# Patient Record
Sex: Male | Born: 1995
Health system: Southern US, Community
[De-identification: ages and names within clinical notes are randomized; demographics above are authoritative.]

## PROBLEM LIST (undated history)

## (undated) ENCOUNTER — Ambulatory Visit
Admission: EM | Disposition: A | Discharge status: Home / Self Care | Payer: No Typology Code available for payment source

## (undated) DIAGNOSIS — S069XAA Unspecified intracranial injury with loss of consciousness status unknown, initial encounter: Secondary | ICD-10-CM

## (undated) DIAGNOSIS — S069X9A Unspecified intracranial injury with loss of consciousness of unspecified duration, initial encounter: Secondary | ICD-10-CM

## (undated) DIAGNOSIS — S42109A Fracture of unspecified part of scapula, unspecified shoulder, initial encounter for closed fracture: Secondary | ICD-10-CM

## (undated) HISTORY — DX: Unspecified intracranial injury with loss of consciousness status unknown, initial encounter: S06.9XAA

## (undated) HISTORY — DX: Unspecified intracranial injury with loss of consciousness of unspecified duration, initial encounter: S06.9X9A

## (undated) HISTORY — DX: Fracture of unspecified part of scapula, unspecified shoulder, initial encounter for closed fracture: S42.109A

---
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
## 2016-08-20 DIAGNOSIS — L237 Allergic contact dermatitis due to plants, except food: Secondary | ICD-10-CM | POA: Diagnosis not present

## 2016-10-14 DIAGNOSIS — S9031XA Contusion of right foot, initial encounter: Secondary | ICD-10-CM | POA: Diagnosis not present

## 2016-12-02 DIAGNOSIS — Z0100 Encounter for examination of eyes and vision without abnormal findings: Secondary | ICD-10-CM | POA: Diagnosis not present

## 2018-03-14 ENCOUNTER — Ambulatory Visit (INDEPENDENT_AMBULATORY_CARE_PROVIDER_SITE_OTHER): Payer: 59 | Admitting: Family Medicine

## 2018-03-14 ENCOUNTER — Encounter: Payer: Self-pay | Admitting: Family Medicine

## 2018-03-14 VITALS — BP 132/74 | HR 64 | Temp 98.2°F | Resp 12 | Ht 70.0 in | Wt 172.0 lb

## 2018-03-14 DIAGNOSIS — Z7689 Persons encountering health services in other specified circumstances: Secondary | ICD-10-CM

## 2018-03-14 DIAGNOSIS — Z0000 Encounter for general adult medical examination without abnormal findings: Secondary | ICD-10-CM | POA: Diagnosis not present

## 2018-03-14 NOTE — Progress Notes (Signed)
Subjective:    Patient ID: Matthew Young, male    DOB: [birth year], 22 y.o.   MRN: [id number]  HPI  Patient is a 22 year old white male here today to establish care.  He has no specific concerns.  He is due for a tetanus shot which he politely declines.  He is also due for a flu shot which he declines.  Regarding his quality metrics, he is due for an HIV screen however the patient refuses that today.  He denies any medical concerns. No past medical history on file. Patient reports no specific past medical history.  He has never been hospitalized.  He denies any surgeries.  He takes no regular medication. No current outpatient medications on file prior to visit.   No current facility-administered medications on file prior to visit.    Allergies  Allergen Reactions  . Amoxicillin Other (See Comments)    childhood  . Penicillin G Other (See Comments)    Childhood   Social History   Socioeconomic History  . Marital status: Single    Spouse name: Not on file  . Number of children: Not on file  . Years of education: Not on file  . Highest education level: Not on file  Occupational History  . Not on file  Social Needs  . Financial resource strain: Not on file  . Food insecurity:    Worry: Not on file    Inability: Not on file  . Transportation needs:    Medical: Not on file    Non-medical: Not on file  Tobacco Use  . Smoking status: Never Smoker  . Smokeless tobacco: Never Used  Substance and Sexual Activity  . Alcohol use: Not on file    Comment: Occasional  . Drug use: Never  . Sexual activity: Not on file  Lifestyle  . Physical activity:    Days per week: Not on file    Minutes per session: Not on file  . Stress: Not on file  Relationships  . Social connections:    Talks on phone: Not on file    Gets together: Not on file    Attends religious service: Not on file    Active member of club or organization: Not on file    Attends meetings of clubs or  organizations: Not on file    Relationship status: Not on file  . Intimate partner violence:    Fear of current or ex partner: Not on file    Emotionally abused: Not on file    Physically abused: Not on file    Forced sexual activity: Not on file  Other Topics Concern  . Not on file  Social History Narrative  . Not on file   He denies any smoking or tobacco use.  He reports occasional alcohol use.  He denies any recreational drug use.  He is married with no children.  He works in [employer]eron[employer]. Family History  Problem Relation Age of Onset  . Cancer Maternal Aunt        prostate  . Hypertension Paternal Uncle   . Hyperlipidemia Paternal Uncle   . Heart disease Paternal Uncle   . Hyperlipidemia Paternal Grandmother   . Hypertension Paternal Grandmother   . Hyperlipidemia Paternal Grandfather   . Hypertension Paternal Grandfather    Family history is significant for a paternal uncle who died suddenly in his 5440s from a presumed heart attack.  Review of Systems  All other systems reviewed and are negative.  Objective:   Physical Exam  Constitutional: He is oriented to person, place, and time. He appears well-developed and well-nourished. No distress.  HENT:  Head: Normocephalic and atraumatic.  Right Ear: External ear normal.  Left Ear: External ear normal.  Nose: Nose normal.  Mouth/Throat: Oropharynx is clear and moist. No oropharyngeal exudate.  Eyes: Pupils are equal, round, and reactive to light. Conjunctivae and EOM are normal. Right eye exhibits no discharge. Left eye exhibits no discharge. No scleral icterus.  Neck: Normal range of motion. Neck supple. No JVD present. No tracheal deviation present. No thyromegaly present.  Cardiovascular: Normal rate, regular rhythm, normal heart sounds and intact distal pulses. Exam reveals no gallop and no friction rub.  No murmur heard. Pulmonary/Chest: Effort normal and breath sounds normal. No stridor. No respiratory distress.  He has no wheezes. He has no rales. He exhibits no tenderness.  Abdominal: Soft. Bowel sounds are normal. He exhibits no distension and no mass. There is no tenderness. There is no rebound and no guarding.  Musculoskeletal: Normal range of motion. He exhibits no edema, tenderness or deformity.  Lymphadenopathy:    He has no cervical adenopathy.  Neurological: He is alert and oriented to person, place, and time. He has normal reflexes. He displays normal reflexes. No cranial nerve deficit. He exhibits normal muscle tone. Coordination normal.  Skin: Skin is warm. No rash noted. He is not diaphoretic. No erythema. No pallor.  Psychiatric: He has a normal mood and affect. His behavior is normal. Judgment and thought content normal.  Vitals reviewed.         Assessment & Plan:  General medical exam - Plan: CBC with Differential/Platelet, COMPLETE METABOLIC PANEL WITH GFR, Lipid panel  Establishing care with new doctor, encounter for   Physical exam today is completely normal.  Blood pressure is excellent.  BMI is normal.  Recommended a tetanus vaccine but the patient deferred.  Offer the patient HIV screening but he politely declined.  I will check a CBC, CMP, fasting lipid panel.  Otherwise there are no concerns that I have based on his physical exam.  Follow-up annually or as needed

## 2018-03-15 ENCOUNTER — Encounter: Payer: Self-pay | Admitting: Family Medicine

## 2018-03-15 LAB — CBC WITH DIFFERENTIAL/PLATELET
Basophils Absolute: 49 cells/uL (ref 0–200)
Basophils Relative: 1 %
Eosinophils Absolute: 172 cells/uL (ref 15–500)
Eosinophils Relative: 3.5 %
HCT: 44.6 % (ref 38.5–50.0)
Hemoglobin: 15.6 g/dL (ref 13.2–17.1)
Lymphs Abs: 1720 cells/uL (ref 850–3900)
MCH: 31.4 pg (ref 27.0–33.0)
MCHC: 35 g/dL (ref 32.0–36.0)
MCV: 89.7 fL (ref 80.0–100.0)
MPV: 10.9 fL (ref 7.5–12.5)
Monocytes Relative: 8.8 %
Neutro Abs: 2528 cells/uL (ref 1500–7800)
Neutrophils Relative %: 51.6 %
Platelets: 233 10*3/uL (ref 140–400)
RBC: 4.97 10*6/uL (ref 4.20–5.80)
RDW: 12 % (ref 11.0–15.0)
Total Lymphocyte: 35.1 %
WBC mixed population: 431 cells/uL (ref 200–950)
WBC: 4.9 10*3/uL (ref 3.8–10.8)

## 2018-03-15 LAB — COMPLETE METABOLIC PANEL WITH GFR
AG Ratio: 2.6 (calc) — ABNORMAL HIGH (ref 1.0–2.5)
ALT: 16 U/L (ref 9–46)
AST: 20 U/L (ref 10–40)
Albumin: 5 g/dL (ref 3.6–5.1)
Alkaline phosphatase (APISO): 61 U/L (ref 40–115)
BUN: 13 mg/dL (ref 7–25)
CO2: 29 mmol/L (ref 20–32)
Calcium: 10 mg/dL (ref 8.6–10.3)
Chloride: 103 mmol/L (ref 98–110)
Creat: 0.98 mg/dL (ref 0.60–1.35)
GFR, Est African American: 125 mL/min/{1.73_m2} (ref >=60)
GFR, Est Non African American: 108 mL/min/{1.73_m2} (ref >=60)
Globulin: 1.9 g/dL (calc) (ref 1.9–3.7)
Glucose, Bld: 105 mg/dL — ABNORMAL HIGH (ref 65–99)
Potassium: 4.2 mmol/L (ref 3.5–5.3)
Sodium: 142 mmol/L (ref 135–146)
Total Bilirubin: 0.4 mg/dL (ref 0.2–1.2)
Total Protein: 6.9 g/dL (ref 6.1–8.1)

## 2018-03-15 LAB — SPECIMEN COMPROMISED

## 2018-03-15 LAB — LIPID PANEL
Cholesterol: 150 mg/dL (ref <200)
HDL: 50 mg/dL (ref >40)
LDL Cholesterol (Calc): 86 mg/dL (calc)
Non-HDL Cholesterol (Calc): 100 mg/dL (calc) (ref <130)
Total CHOL/HDL Ratio: 3 (calc) (ref <5.0)
Triglycerides: 57 mg/dL (ref <150)

## 2018-06-21 ENCOUNTER — Emergency Department (HOSPITAL_COMMUNITY): Payer: 59

## 2018-06-21 ENCOUNTER — Inpatient Hospital Stay (HOSPITAL_COMMUNITY)
Admission: EM | Admit: 2018-06-21 | Discharge: 2018-06-28 | DRG: 963 | Disposition: A | Discharge status: Home / Self Care | Payer: 59 | Attending: General Surgery | Admitting: General Surgery

## 2018-06-21 ENCOUNTER — Encounter (HOSPITAL_COMMUNITY): Payer: Self-pay | Admitting: Emergency Medicine

## 2018-06-21 DIAGNOSIS — T07XXXA Unspecified multiple injuries, initial encounter: Secondary | ICD-10-CM

## 2018-06-21 DIAGNOSIS — K5903 Drug induced constipation: Secondary | ICD-10-CM | POA: Diagnosis not present

## 2018-06-21 DIAGNOSIS — S27321A Contusion of lung, unilateral, initial encounter: Secondary | ICD-10-CM | POA: Diagnosis present

## 2018-06-21 DIAGNOSIS — T402X5A Adverse effect of other opioids, initial encounter: Secondary | ICD-10-CM

## 2018-06-21 DIAGNOSIS — S069X9A Unspecified intracranial injury with loss of consciousness of unspecified duration, initial encounter: Secondary | ICD-10-CM | POA: Diagnosis not present

## 2018-06-21 DIAGNOSIS — E872 Acidosis: Secondary | ICD-10-CM | POA: Diagnosis present

## 2018-06-21 DIAGNOSIS — R402242 Coma scale, best verbal response, confused conversation, at arrival to emergency department: Secondary | ICD-10-CM | POA: Diagnosis present

## 2018-06-21 DIAGNOSIS — R402112 Coma scale, eyes open, never, at arrival to emergency department: Secondary | ICD-10-CM | POA: Diagnosis present

## 2018-06-21 DIAGNOSIS — J9602 Acute respiratory failure with hypercapnia: Secondary | ICD-10-CM | POA: Diagnosis present

## 2018-06-21 DIAGNOSIS — S0990XA Unspecified injury of head, initial encounter: Secondary | ICD-10-CM | POA: Diagnosis not present

## 2018-06-21 DIAGNOSIS — S27329A Contusion of lung, unspecified, initial encounter: Secondary | ICD-10-CM | POA: Diagnosis present

## 2018-06-21 DIAGNOSIS — R402352 Coma scale, best motor response, localizes pain, at arrival to emergency department: Secondary | ICD-10-CM | POA: Diagnosis present

## 2018-06-21 DIAGNOSIS — S06360A Traumatic hemorrhage of cerebrum, unspecified, without loss of consciousness, initial encounter: Principal | ICD-10-CM | POA: Diagnosis present

## 2018-06-21 DIAGNOSIS — J9600 Acute respiratory failure, unspecified whether with hypoxia or hypercapnia: Secondary | ICD-10-CM | POA: Diagnosis not present

## 2018-06-21 DIAGNOSIS — Z880 Allergy status to penicillin: Secondary | ICD-10-CM

## 2018-06-21 DIAGNOSIS — R451 Restlessness and agitation: Secondary | ICD-10-CM | POA: Diagnosis not present

## 2018-06-21 DIAGNOSIS — R52 Pain, unspecified: Secondary | ICD-10-CM

## 2018-06-21 DIAGNOSIS — S299XXA Unspecified injury of thorax, initial encounter: Secondary | ICD-10-CM | POA: Diagnosis not present

## 2018-06-21 DIAGNOSIS — L659 Nonscarring hair loss, unspecified: Secondary | ICD-10-CM | POA: Diagnosis not present

## 2018-06-21 DIAGNOSIS — S0181XA Laceration without foreign body of other part of head, initial encounter: Secondary | ICD-10-CM | POA: Diagnosis present

## 2018-06-21 DIAGNOSIS — S20319A Abrasion of unspecified front wall of thorax, initial encounter: Secondary | ICD-10-CM | POA: Diagnosis present

## 2018-06-21 DIAGNOSIS — S3993XA Unspecified injury of pelvis, initial encounter: Secondary | ICD-10-CM | POA: Diagnosis not present

## 2018-06-21 DIAGNOSIS — R4182 Altered mental status, unspecified: Secondary | ICD-10-CM | POA: Diagnosis not present

## 2018-06-21 DIAGNOSIS — S01112A Laceration without foreign body of left eyelid and periocular area, initial encounter: Secondary | ICD-10-CM | POA: Diagnosis present

## 2018-06-21 DIAGNOSIS — M542 Cervicalgia: Secondary | ICD-10-CM | POA: Diagnosis not present

## 2018-06-21 DIAGNOSIS — D62 Acute posthemorrhagic anemia: Secondary | ICD-10-CM | POA: Diagnosis not present

## 2018-06-21 DIAGNOSIS — Z298 Encounter for other specified prophylactic measures: Secondary | ICD-10-CM

## 2018-06-21 DIAGNOSIS — Y92410 Unspecified street and highway as the place of occurrence of the external cause: Secondary | ICD-10-CM

## 2018-06-21 DIAGNOSIS — S61412A Laceration without foreign body of left hand, initial encounter: Secondary | ICD-10-CM | POA: Diagnosis present

## 2018-06-21 DIAGNOSIS — S3991XA Unspecified injury of abdomen, initial encounter: Secondary | ICD-10-CM | POA: Diagnosis not present

## 2018-06-21 DIAGNOSIS — S3021XA Contusion of penis, initial encounter: Secondary | ICD-10-CM | POA: Diagnosis present

## 2018-06-21 DIAGNOSIS — E876 Hypokalemia: Secondary | ICD-10-CM | POA: Diagnosis present

## 2018-06-21 DIAGNOSIS — Z2989 Encounter for other specified prophylactic measures: Secondary | ICD-10-CM

## 2018-06-21 DIAGNOSIS — S0101XA Laceration without foreign body of scalp, initial encounter: Secondary | ICD-10-CM

## 2018-06-21 DIAGNOSIS — S42116A Nondisplaced fracture of body of scapula, unspecified shoulder, initial encounter for closed fracture: Secondary | ICD-10-CM | POA: Diagnosis not present

## 2018-06-21 DIAGNOSIS — S42112A Displaced fracture of body of scapula, left shoulder, initial encounter for closed fracture: Secondary | ICD-10-CM | POA: Diagnosis present

## 2018-06-21 DIAGNOSIS — S062X9A Diffuse traumatic brain injury with loss of consciousness of unspecified duration, initial encounter: Secondary | ICD-10-CM | POA: Diagnosis not present

## 2018-06-21 DIAGNOSIS — S06899A Other specified intracranial injury with loss of consciousness of unspecified duration, initial encounter: Secondary | ICD-10-CM | POA: Diagnosis not present

## 2018-06-21 LAB — I-STAT CG4 LACTIC ACID, ED: Lactic Acid, Venous: 4.88 mmol/L (ref 0.5–1.9)

## 2018-06-21 LAB — PREPARE FRESH FROZEN PLASMA
Unit division: 0
Unit division: 0

## 2018-06-21 LAB — ABO/RH: ABO/RH(D): O POS

## 2018-06-21 LAB — TYPE AND SCREEN
ABO/RH(D): O POS
Antibody Screen: NEGATIVE
Unit division: 0
Unit division: 0

## 2018-06-21 LAB — COMPREHENSIVE METABOLIC PANEL
ALT: 282 U/L — ABNORMAL HIGH (ref 0–44)
AST: 331 U/L — ABNORMAL HIGH (ref 15–41)
Albumin: 4.8 g/dL (ref 3.5–5.0)
Alkaline Phosphatase: 53 U/L (ref 38–126)
Anion gap: 14 (ref 5–15)
BUN: 14 mg/dL (ref 6–20)
CO2: 26 mmol/L (ref 22–32)
Calcium: 9.5 mg/dL (ref 8.9–10.3)
Chloride: 100 mmol/L (ref 98–111)
Creatinine, Ser: 1.33 mg/dL — ABNORMAL HIGH (ref 0.61–1.24)
GFR calc Af Amer: >60 mL/min (ref >60)
GFR calc non Af Amer: >60 mL/min (ref >60)
Glucose, Bld: 142 mg/dL — ABNORMAL HIGH (ref 70–99)
Potassium: 3.3 mmol/L — ABNORMAL LOW (ref 3.5–5.1)
Sodium: 140 mmol/L (ref 135–145)
Total Bilirubin: 0.9 mg/dL (ref 0.3–1.2)
Total Protein: 7.3 g/dL (ref 6.5–8.1)

## 2018-06-21 LAB — TRIGLYCERIDES: Triglycerides: 169 mg/dL — ABNORMAL HIGH (ref <150)

## 2018-06-21 LAB — CBC
HCT: 45.3 % (ref 39.0–52.0)
Hemoglobin: 15 g/dL (ref 13.0–17.0)
MCH: 30.4 pg (ref 26.0–34.0)
MCHC: 33.1 g/dL (ref 30.0–36.0)
MCV: 91.9 fL (ref 78.0–100.0)
Platelets: 312 10*3/uL (ref 150–400)
RBC: 4.93 MIL/uL (ref 4.22–5.81)
RDW: 11.6 % (ref 11.5–15.5)
WBC: 15.5 10*3/uL — ABNORMAL HIGH (ref 4.0–10.5)

## 2018-06-21 LAB — URINALYSIS, ROUTINE W REFLEX MICROSCOPIC
Bilirubin Urine: NEGATIVE
Glucose, UA: NEGATIVE mg/dL
Ketones, ur: NEGATIVE mg/dL
Leukocytes, UA: NEGATIVE
Nitrite: NEGATIVE
Protein, ur: 100 mg/dL — AB
Specific Gravity, Urine: >1.046 — ABNORMAL HIGH (ref 1.005–1.030)
pH: 5 (ref 5.0–8.0)

## 2018-06-21 LAB — I-STAT CHEM 8, ED
BUN: 18 mg/dL (ref 6–20)
Calcium, Ion: 1.16 mmol/L (ref 1.15–1.40)
Chloride: 98 mmol/L (ref 98–111)
Creatinine, Ser: 1.2 mg/dL (ref 0.61–1.24)
Glucose, Bld: 139 mg/dL — ABNORMAL HIGH (ref 70–99)
HCT: 46 % (ref 39.0–52.0)
Hemoglobin: 15.6 g/dL (ref 13.0–17.0)
Potassium: 3.2 mmol/L — ABNORMAL LOW (ref 3.5–5.1)
Sodium: 141 mmol/L (ref 135–145)
TCO2: 28 mmol/L (ref 22–32)

## 2018-06-21 LAB — POCT I-STAT 3, ART BLOOD GAS (G3+)
Acid-Base Excess: 1 mmol/L (ref 0.0–2.0)
Bicarbonate: 28.2 mmol/L — ABNORMAL HIGH (ref 20.0–28.0)
O2 Saturation: 100 %
TCO2: 30 mmol/L (ref 22–32)
pCO2 arterial: 53.7 mmHg — ABNORMAL HIGH (ref 32.0–48.0)
pH, Arterial: 7.329 — ABNORMAL LOW (ref 7.350–7.450)
pO2, Arterial: 439 mmHg — ABNORMAL HIGH (ref 83.0–108.0)

## 2018-06-21 LAB — BPAM FFP
Blood Product Expiration Date: 201907212359
Blood Product Expiration Date: 201908042359
ISSUE DATE / TIME: 201907161553
ISSUE DATE / TIME: 201907161553
Unit Type and Rh: 6200
Unit Type and Rh: 6200

## 2018-06-21 LAB — BPAM RBC
Blood Product Expiration Date: 201907252359
Blood Product Expiration Date: 201908082359
ISSUE DATE / TIME: 201907161552
ISSUE DATE / TIME: 201907161552
Unit Type and Rh: 9500
Unit Type and Rh: 9500

## 2018-06-21 LAB — ETHANOL: Alcohol, Ethyl (B): <10 mg/dL (ref <10)

## 2018-06-21 LAB — MRSA PCR SCREENING: MRSA by PCR: NEGATIVE

## 2018-06-21 LAB — PROTIME-INR
INR: 1.08
Prothrombin Time: 13.9 seconds (ref 11.4–15.2)

## 2018-06-21 MED ORDER — FENTANYL CITRATE (PF) 100 MCG/2ML IJ SOLN
INTRAMUSCULAR | Status: AC
  Filled 2018-06-21: qty 2

## 2018-06-21 MED ORDER — ETOMIDATE 2 MG/ML IV SOLN
INTRAVENOUS | Status: AC | PRN
  Administered 2018-06-21: 20 mg via INTRAVENOUS

## 2018-06-21 MED ORDER — IOHEXOL 300 MG/ML  SOLN
100.0000 mL | Freq: Once | INTRAMUSCULAR | Status: AC | PRN
  Administered 2018-06-21: 100 mL via INTRAVENOUS

## 2018-06-21 MED ORDER — SODIUM CHLORIDE 0.9 % IV SOLN
INTRAVENOUS | Status: AC | PRN
  Administered 2018-06-21: 1000 mL via INTRAVENOUS

## 2018-06-21 MED ORDER — FENTANYL 2500MCG IN NS 250ML (10MCG/ML) PREMIX INFUSION
25.0000 ug/h | INTRAVENOUS | Status: DC
  Administered 2018-06-21: 150 ug/h via INTRAVENOUS
  Administered 2018-06-21: 25 ug/h via INTRAVENOUS
  Administered 2018-06-22: 150 ug/h via INTRAVENOUS
  Filled 2018-06-21: qty 250

## 2018-06-21 MED ORDER — ACETAMINOPHEN 160 MG/5ML PO SOLN: 650.0000 mg | Freq: Four times a day (QID) | ORAL | Status: DC | PRN

## 2018-06-21 MED ORDER — FENTANYL CITRATE (PF) 100 MCG/2ML IJ SOLN
INTRAMUSCULAR | Status: AC | PRN
  Administered 2018-06-21: 100 ug via INTRAVENOUS

## 2018-06-21 MED ORDER — LIDOCAINE-EPINEPHRINE (PF) 2 %-1:200000 IJ SOLN
10.0000 mL | Freq: Once | INTRAMUSCULAR | Status: AC
  Administered 2018-06-21: 10 mL
  Filled 2018-06-21: qty 20

## 2018-06-21 MED ORDER — ALBUMIN HUMAN 5 % IV SOLN
25.0000 g | Freq: Once | INTRAVENOUS | Status: AC
  Administered 2018-06-21: 25 g via INTRAVENOUS
  Filled 2018-06-21: qty 500

## 2018-06-21 MED ORDER — ONDANSETRON HCL 4 MG/2ML IJ SOLN: 4.0000 mg | Freq: Four times a day (QID) | INTRAMUSCULAR | Status: DC | PRN

## 2018-06-21 MED ORDER — FAMOTIDINE IN NACL 20-0.9 MG/50ML-% IV SOLN
20.0000 mg | INTRAVENOUS | Status: DC
  Administered 2018-06-21 – 2018-06-25 (×5): 20 mg via INTRAVENOUS
  Filled 2018-06-21 (×5): qty 50

## 2018-06-21 MED ORDER — PROPOFOL 1000 MG/100ML IV EMUL
INTRAVENOUS | Status: AC
  Filled 2018-06-21: qty 100

## 2018-06-21 MED ORDER — PROPOFOL 1000 MG/100ML IV EMUL
INTRAVENOUS | Status: AC | PRN
  Administered 2018-06-21: 5 ug/kg/min via INTRAVENOUS

## 2018-06-21 MED ORDER — PROPOFOL 1000 MG/100ML IV EMUL
0.0000 ug/kg/min | INTRAVENOUS | Status: DC
  Administered 2018-06-21: 50 ug/kg/min via INTRAVENOUS
  Administered 2018-06-22: 40 ug/kg/min via INTRAVENOUS
  Administered 2018-06-22: 20 ug/kg/min via INTRAVENOUS
  Administered 2018-06-22: 40 ug/kg/min via INTRAVENOUS
  Filled 2018-06-21 (×5): qty 100

## 2018-06-21 MED ORDER — FENTANYL BOLUS VIA INFUSION
50.0000 ug | INTRAVENOUS | Status: DC | PRN
  Filled 2018-06-21: qty 50

## 2018-06-21 MED ORDER — ONDANSETRON 4 MG PO TBDP: 4.0000 mg | ORAL_TABLET | Freq: Four times a day (QID) | ORAL | Status: DC | PRN

## 2018-06-21 MED ORDER — VECURONIUM BROMIDE 10 MG IV SOLR
10.0000 mg | Freq: Once | INTRAVENOUS | Status: AC
  Administered 2018-06-21: 10 mg via INTRAVENOUS
  Filled 2018-06-21: qty 10

## 2018-06-21 MED ORDER — STERILE WATER FOR INJECTION IJ SOLN
INTRAMUSCULAR | Status: AC
  Filled 2018-06-21: qty 10

## 2018-06-21 MED ORDER — VECURONIUM BROMIDE 10 MG IV SOLR
INTRAVENOUS | Status: AC | PRN
  Administered 2018-06-21: 10 mg via INTRAVENOUS

## 2018-06-21 MED ORDER — LIDOCAINE HCL (PF) 1 % IJ SOLN
30.0000 mL | Freq: Once | INTRAMUSCULAR | Status: DC
  Filled 2018-06-21: qty 30

## 2018-06-21 MED ORDER — SUCCINYLCHOLINE CHLORIDE 20 MG/ML IJ SOLN
INTRAMUSCULAR | Status: AC | PRN
  Administered 2018-06-21: 100 mg via INTRAVENOUS

## 2018-06-21 MED ORDER — FENTANYL CITRATE (PF) 100 MCG/2ML IJ SOLN
50.0000 ug | Freq: Once | INTRAMUSCULAR | Status: DC
  Filled 2018-06-21: qty 2

## 2018-06-21 MED ORDER — LEVETIRACETAM IN NACL 500 MG/100ML IV SOLN
500.0000 mg | Freq: Two times a day (BID) | INTRAVENOUS | Status: DC
  Administered 2018-06-21 – 2018-06-26 (×10): 500 mg via INTRAVENOUS
  Filled 2018-06-21 (×10): qty 100

## 2018-06-21 MED ORDER — POTASSIUM CHLORIDE IN NACL 20-0.9 MEQ/L-% IV SOLN
INTRAVENOUS | Status: DC
  Administered 2018-06-21 – 2018-06-23 (×4): via INTRAVENOUS
  Filled 2018-06-21 (×5): qty 1000

## 2018-06-21 NOTE — Progress Notes (Signed)
Orthopedic Tech Progress Note Patient Details:  Matthew Young 12/07/1877 [id number]  Patient ID: Matthew Young, male   DOB: 12/07/1877, [age] y.o.   MRN: [id number]   Nikki DomCrawford, [name] 06/21/2018, 4:30 PM Made level 1 trauma visit
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
## 2018-06-21 NOTE — Progress Notes (Signed)
Patient transported on ventilator to 4N21 with no complications.
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
## 2018-06-22 ENCOUNTER — Inpatient Hospital Stay (HOSPITAL_COMMUNITY): Payer: 59

## 2018-06-22 LAB — LACTIC ACID, PLASMA: Lactic Acid, Venous: 1 mmol/L (ref 0.5–1.9)

## 2018-06-22 LAB — CBC
HCT: 36.5 % — ABNORMAL LOW (ref 39.0–52.0)
Hemoglobin: 12.5 g/dL — ABNORMAL LOW (ref 13.0–17.0)
MCH: 31.1 pg (ref 26.0–34.0)
MCHC: 34.2 g/dL (ref 30.0–36.0)
MCV: 90.8 fL (ref 78.0–100.0)
Platelets: 204 10*3/uL (ref 150–400)
RBC: 4.02 MIL/uL — ABNORMAL LOW (ref 4.22–5.81)
RDW: 11.9 % (ref 11.5–15.5)
WBC: 12.4 10*3/uL — ABNORMAL HIGH (ref 4.0–10.5)

## 2018-06-22 LAB — CREATININE, SERUM
Creatinine, Ser: 1.12 mg/dL (ref 0.61–1.24)
GFR calc Af Amer: >60 mL/min (ref >60)
GFR calc non Af Amer: >60 mL/min (ref >60)

## 2018-06-22 LAB — BLOOD PRODUCT ORDER (VERBAL) VERIFICATION

## 2018-06-22 LAB — CDS SEROLOGY

## 2018-06-22 LAB — HIV ANTIBODY (ROUTINE TESTING W REFLEX): HIV Screen 4th Generation wRfx: NONREACTIVE

## 2018-06-22 MED ORDER — SODIUM CHLORIDE 0.9 % IV BOLUS
1000.0000 mL | Freq: Once | INTRAVENOUS | Status: AC
  Administered 2018-06-22: 1000 mL via INTRAVENOUS

## 2018-06-22 MED ORDER — ORAL CARE MOUTH RINSE
15.0000 mL | OROMUCOSAL | Status: DC
  Administered 2018-06-22 – 2018-06-23 (×15): 15 mL via OROMUCOSAL

## 2018-06-22 MED ORDER — LIDOCAINE-EPINEPHRINE 1 %-1:100000 IJ SOLN
10.0000 mL | Freq: Once | INTRAMUSCULAR | Status: AC
  Administered 2018-06-22: 10 mL via INTRADERMAL
  Filled 2018-06-22: qty 1

## 2018-06-22 MED ORDER — POTASSIUM CHLORIDE 10 MEQ/100ML IV SOLN
10.0000 meq | INTRAVENOUS | Status: AC
  Administered 2018-06-22 (×3): 10 meq via INTRAVENOUS
  Filled 2018-06-22 (×3): qty 100

## 2018-06-22 MED ORDER — BACITRACIN ZINC 500 UNIT/GM EX OINT
1.0000 "application " | TOPICAL_OINTMENT | Freq: Two times a day (BID) | CUTANEOUS | Status: DC
  Administered 2018-06-22 – 2018-06-28 (×13): 1 via TOPICAL
  Filled 2018-06-22: qty 28.4

## 2018-06-22 MED ORDER — CHLORHEXIDINE GLUCONATE 0.12% ORAL RINSE (MEDLINE KIT)
15.0000 mL | Freq: Two times a day (BID) | OROMUCOSAL | Status: DC
  Administered 2018-06-22 – 2018-06-23 (×4): 15 mL via OROMUCOSAL
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
## 2018-06-22 NOTE — Progress Notes (Signed)
Placed patient back on PRVC due to respiratory difficulty on CPAP/PSV  Mode. No current respiratory distress.

## 2018-06-22 NOTE — Procedures (Addendum)
Wound: 3cm x 1 cm complex laceration palmar aspect left hand   Consent was obtained from the patients wife prior to the procedure. The patients hand was prepped and draped in the typical sterile fashion. Local anesthesia achieved with 8 mL of 1% lidocaine with epinephrine. Wound thoroughly irrigated. Laceration was repaired with 4 vertical mattress prolene sutures and 2 simple interrupted prolene sutures. The patient tolerated the procedure well without complications or blood loss. Instructions for wound care provided and the nursing staff voiced understanding.  Hosie SpangleElizabeth Haileyann Staiger, PA-C [facility] Pager: [phone]
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
## 2018-06-22 NOTE — Progress Notes (Signed)
Patient transported to CT and back without any complications.  

## 2018-06-22 NOTE — Progress Notes (Signed)
CPAP/PS vent mode attempted. Patient has no respiratory effort at this time. Placed patient back on previous settings.

## 2018-06-22 NOTE — Progress Notes (Signed)
Neurosurgery Progress Note  No issues overnight.  Remains intubated and sedated. Nursing reports when sedation was lifted, he was following commands and MAEW  EXAM:  BP 125/75   Pulse 75   Temp 99.9 F (37.7 C) (Axillary)   Resp 16   Ht 5\' 10"  (1.778 m)   Wt 80 kg (176 lb 5.9 oz)   SpO2 100%   BMI 25.31 kg/m   Intubated, sedated When off sedation, was MAEW and following commands  PLAN Doing well when off sedation. Repeat head CT stable.  No new NS recs

## 2018-06-23 ENCOUNTER — Inpatient Hospital Stay (HOSPITAL_COMMUNITY): Payer: 59

## 2018-06-23 LAB — CBC
HCT: 34.8 % — ABNORMAL LOW (ref 39.0–52.0)
Hemoglobin: 11.6 g/dL — ABNORMAL LOW (ref 13.0–17.0)
MCH: 30.9 pg (ref 26.0–34.0)
MCHC: 33.3 g/dL (ref 30.0–36.0)
MCV: 92.8 fL (ref 78.0–100.0)
Platelets: 175 10*3/uL (ref 150–400)
RBC: 3.75 MIL/uL — ABNORMAL LOW (ref 4.22–5.81)
RDW: 11.9 % (ref 11.5–15.5)
WBC: 11.8 10*3/uL — ABNORMAL HIGH (ref 4.0–10.5)

## 2018-06-23 LAB — BASIC METABOLIC PANEL
Anion gap: 10 (ref 5–15)
BUN: 16 mg/dL (ref 6–20)
CO2: 21 mmol/L — ABNORMAL LOW (ref 22–32)
Calcium: 8.6 mg/dL — ABNORMAL LOW (ref 8.9–10.3)
Chloride: 109 mmol/L (ref 98–111)
Creatinine, Ser: 1.15 mg/dL (ref 0.61–1.24)
GFR calc Af Amer: >60 mL/min (ref >60)
GFR calc non Af Amer: >60 mL/min (ref >60)
Glucose, Bld: 78 mg/dL (ref 70–99)
Potassium: 3.9 mmol/L (ref 3.5–5.1)
Sodium: 140 mmol/L (ref 135–145)

## 2018-06-23 MED ORDER — METHOCARBAMOL 1000 MG/10ML IJ SOLN
1000.0000 mg | Freq: Three times a day (TID) | INTRAVENOUS | Status: DC | PRN
  Administered 2018-06-23 – 2018-06-26 (×5): 1000 mg via INTRAVENOUS
  Filled 2018-06-23 (×9): qty 10

## 2018-06-23 MED ORDER — LORAZEPAM 2 MG/ML IJ SOLN: 0.5000 mg | INTRAMUSCULAR | Status: DC | PRN

## 2018-06-23 MED ORDER — ACETAMINOPHEN 10 MG/ML IV SOLN
1000.0000 mg | Freq: Four times a day (QID) | INTRAVENOUS | Status: AC
  Administered 2018-06-23 – 2018-06-24 (×4): 1000 mg via INTRAVENOUS
  Filled 2018-06-23 (×4): qty 100

## 2018-06-23 MED ORDER — LORAZEPAM 2 MG/ML IJ SOLN
1.0000 mg | INTRAMUSCULAR | Status: DC | PRN
  Administered 2018-06-23 – 2018-06-24 (×4): 1 mg via INTRAVENOUS
  Filled 2018-06-23 (×5): qty 1

## 2018-06-23 MED ORDER — OXYCODONE HCL 5 MG PO TABS
5.0000 mg | ORAL_TABLET | ORAL | Status: DC | PRN
  Administered 2018-06-24 – 2018-06-26 (×10): 10 mg via ORAL
  Administered 2018-06-27: 5 mg via ORAL
  Administered 2018-06-27: 10 mg via ORAL
  Administered 2018-06-27: 5 mg via ORAL
  Filled 2018-06-23: qty 2
  Filled 2018-06-23 (×2): qty 1
  Filled 2018-06-23 (×11): qty 2

## 2018-06-23 MED ORDER — FENTANYL CITRATE (PF) 100 MCG/2ML IJ SOLN
50.0000 ug | INTRAMUSCULAR | Status: DC | PRN
  Administered 2018-06-23 – 2018-06-25 (×6): 100 ug via INTRAVENOUS
  Filled 2018-06-23 (×6): qty 2
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
## 2018-06-23 NOTE — Procedures (Signed)
Extubation Procedure Note  Patient Details:   Name: Matthew Young DOB: [birth year] MRN: [id number]   Airway Documentation:  + cuff leak test prior to extubation   Vent end date: 06/23/18 Vent end time: 0917   Evaluation  O2 sats: stable throughout Complications: No apparent complications Patient did tolerate procedure well. Bilateral Breath Sounds: Clear   Yes pt able to speak.  No distress noted, no stridor noted.  VSS.   Jennette KettleBrowning, Bravlio Luca Joy 06/23/2018, 9:21 AM
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
## 2018-06-24 LAB — CBC
HCT: 34.2 % — ABNORMAL LOW (ref 39.0–52.0)
Hemoglobin: 11.5 g/dL — ABNORMAL LOW (ref 13.0–17.0)
MCH: 30.9 pg (ref 26.0–34.0)
MCHC: 33.6 g/dL (ref 30.0–36.0)
MCV: 91.9 fL (ref 78.0–100.0)
Platelets: 177 10*3/uL (ref 150–400)
RBC: 3.72 MIL/uL — ABNORMAL LOW (ref 4.22–5.81)
RDW: 11.6 % (ref 11.5–15.5)
WBC: 9.3 10*3/uL (ref 4.0–10.5)

## 2018-06-24 MED ORDER — ACETAMINOPHEN 325 MG PO TABS
650.0000 mg | ORAL_TABLET | Freq: Four times a day (QID) | ORAL | Status: DC | PRN
  Administered 2018-06-24 – 2018-06-28 (×6): 650 mg via ORAL
  Filled 2018-06-24 (×6): qty 2
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
## 2018-06-24 NOTE — Progress Notes (Signed)
Patient ID: Matthew Young, male   DOB: 1996/03/31, 22 y.o.   MRN: [id number] I met with his family and the therapy team and he is progressing well. I answered their questions.  Georganna Skeans, MD, MPH, FACS Trauma: [phone] General Surgery: [phone]
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
## 2018-06-24 NOTE — Progress Notes (Signed)
Rehab Admissions Coordinator Note:  Patient was screened by Clois DupesBoyette, Autry Droege Godwin for appropriateness for an Inpatient Acute Rehab Consult per OT recommendation.  At this time, we are recommending Inpatient Rehab consult if pt/family would like for pt to be considered for a possible inpt rehab admit. Please advise.  Ottie GlazierBarbara Gretna Bergin, RN, MSN Rehab Admissions Coordinator [phone] 06/24/2018 12:50 PM
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
## 2018-06-24 NOTE — Progress Notes (Signed)
Patient continues to sporadically try to get out of bed, pull at his IVs and remove monitoring equipment. Becomes combative when asked to stay in bed. After talking with PT/OT and Dr Janee Mornhompson, a [place] bed was order for continued patient safety.  The patient's wife, who was the only person in the room at the time, was told of the plan. Once the vail bed arrived I explained to the parents who had since arrived, about the bed.   Parent refused use of the [place] bed.  I educated them (mother, father and wife) on the benefits vs the risk of patient falling out of bed and further injuring himself.   They still refused use of the bed.   Clerance LavMarianne, NP Palliative Care, was in the room at the time. Dr Janee Mornhompson will be notified. Shardae Kleinman C
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
## 2018-06-25 LAB — BASIC METABOLIC PANEL
Anion gap: 8 (ref 5–15)
BUN: 8 mg/dL (ref 6–20)
CO2: 29 mmol/L (ref 22–32)
Calcium: 9.4 mg/dL (ref 8.9–10.3)
Chloride: 106 mmol/L (ref 98–111)
Creatinine, Ser: 0.85 mg/dL (ref 0.61–1.24)
GFR calc Af Amer: >60 mL/min (ref >60)
GFR calc non Af Amer: >60 mL/min (ref >60)
Glucose, Bld: 106 mg/dL — ABNORMAL HIGH (ref 70–99)
Potassium: 3.6 mmol/L (ref 3.5–5.1)
Sodium: 143 mmol/L (ref 135–145)

## 2018-06-25 MED ORDER — SODIUM CHLORIDE 0.9 % IV SOLN
INTRAVENOUS | Status: DC
  Administered 2018-06-25 – 2018-06-26 (×4): via INTRAVENOUS
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
## 2018-06-26 MED ORDER — TRAMADOL HCL 50 MG PO TABS
50.0000 mg | ORAL_TABLET | Freq: Four times a day (QID) | ORAL | Status: DC
  Administered 2018-06-26 – 2018-06-28 (×8): 50 mg via ORAL
  Filled 2018-06-26 (×9): qty 1

## 2018-06-26 MED ORDER — FAMOTIDINE 20 MG PO TABS
20.0000 mg | ORAL_TABLET | Freq: Every day | ORAL | Status: DC
  Administered 2018-06-26 – 2018-06-28 (×3): 20 mg via ORAL
  Filled 2018-06-26 (×3): qty 1

## 2018-06-26 MED ORDER — METHOCARBAMOL 500 MG PO TABS
500.0000 mg | ORAL_TABLET | Freq: Three times a day (TID) | ORAL | Status: DC | PRN
  Administered 2018-06-26 – 2018-06-28 (×5): 500 mg via ORAL
  Filled 2018-06-26 (×5): qty 1

## 2018-06-26 MED ORDER — LEVETIRACETAM 500 MG PO TABS
500.0000 mg | ORAL_TABLET | Freq: Two times a day (BID) | ORAL | Status: DC
  Administered 2018-06-26 – 2018-06-28 (×5): 500 mg via ORAL
  Filled 2018-06-26 (×5): qty 1

## 2018-06-26 MED ORDER — LORAZEPAM 1 MG PO TABS
1.0000 mg | ORAL_TABLET | Freq: Four times a day (QID) | ORAL | Status: DC | PRN
  Administered 2018-06-27: 1 mg via ORAL
  Filled 2018-06-26: qty 1
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
## 2018-06-27 ENCOUNTER — Encounter (HOSPITAL_COMMUNITY): Payer: Self-pay | Admitting: Physical Medicine and Rehabilitation

## 2018-06-27 DIAGNOSIS — D62 Acute posthemorrhagic anemia: Secondary | ICD-10-CM

## 2018-06-27 DIAGNOSIS — Z2989 Encounter for other specified prophylactic measures: Secondary | ICD-10-CM

## 2018-06-27 DIAGNOSIS — Z298 Encounter for other specified prophylactic measures: Secondary | ICD-10-CM

## 2018-06-27 DIAGNOSIS — T07XXXA Unspecified multiple injuries, initial encounter: Secondary | ICD-10-CM

## 2018-06-27 DIAGNOSIS — R52 Pain, unspecified: Secondary | ICD-10-CM

## 2018-06-27 DIAGNOSIS — K5903 Drug induced constipation: Secondary | ICD-10-CM

## 2018-06-27 DIAGNOSIS — T402X5A Adverse effect of other opioids, initial encounter: Secondary | ICD-10-CM

## 2018-06-27 DIAGNOSIS — S0101XA Laceration without foreign body of scalp, initial encounter: Secondary | ICD-10-CM

## 2018-06-27 DIAGNOSIS — S27329A Contusion of lung, unspecified, initial encounter: Secondary | ICD-10-CM

## 2018-06-27 MED ORDER — POLYETHYLENE GLYCOL 3350 17 G PO PACK
17.0000 g | PACK | Freq: Two times a day (BID) | ORAL | Status: DC | PRN
  Administered 2018-06-27 – 2018-06-28 (×2): 17 g via ORAL
  Filled 2018-06-27 (×2): qty 1

## 2018-06-27 MED ORDER — DOCUSATE SODIUM 100 MG PO CAPS
100.0000 mg | ORAL_CAPSULE | Freq: Two times a day (BID) | ORAL | Status: DC
  Administered 2018-06-27 – 2018-06-28 (×3): 100 mg via ORAL
  Filled 2018-06-27 (×3): qty 1
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
## 2018-06-27 NOTE — Progress Notes (Signed)
Inpatient Rehabilitation-Admissions Coordinator   [facility] and [facility] family at the bedside as follow up from PM&R consult. Noted PM&R requested reassessment of pt for CIR due to appearance of functional gains since last therapy notes. Per verbal PT communication, pt is Min G with family able to assist as needed at home with new recommendation for Outpatient vs HH therapy. Pt and family wanting to return home and feel they can manage at current functional level.   Nanine MeansKelly Nataliyah Packham, [name]  Rehab Admissions Coordinator  [phone] 06/27/2018 2:49 PM
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
## 2018-06-27 NOTE — Progress Notes (Signed)
Dr. Emmaline KluverSimaan and Dr. Allena KatzPatel both came and spoke with family regarding placement to CIR.

## 2018-06-27 NOTE — Plan of Care (Signed)
Goal met 

## 2018-06-28 LAB — CREATININE, SERUM
Creatinine, Ser: 0.96 mg/dL (ref 0.61–1.24)
GFR calc Af Amer: >60 mL/min (ref >60)
GFR calc non Af Amer: >60 mL/min (ref >60)

## 2018-06-28 MED ORDER — LEVETIRACETAM 500 MG PO TABS: 500.0000 mg | ORAL_TABLET | Freq: Two times a day (BID) | ORAL | 0 refills | Status: DC

## 2018-06-28 MED ORDER — TRAMADOL HCL 50 MG PO TABS: 50.0000 mg | ORAL_TABLET | Freq: Four times a day (QID) | ORAL | 0 refills | Status: DC | PRN

## 2018-06-28 MED ORDER — POLYETHYLENE GLYCOL 3350 17 G PO PACK: 17.0000 g | PACK | Freq: Two times a day (BID) | ORAL | 0 refills | Status: DC | PRN

## 2018-06-28 MED ORDER — BACITRACIN ZINC 500 UNIT/GM EX OINT: 1.0000 "application " | TOPICAL_OINTMENT | Freq: Two times a day (BID) | CUTANEOUS | 0 refills | Status: DC

## 2018-06-28 MED ORDER — OXYCODONE HCL 5 MG PO TABS: 5.0000 mg | ORAL_TABLET | Freq: Four times a day (QID) | ORAL | 0 refills | Status: DC | PRN

## 2018-06-28 MED ORDER — ACETAMINOPHEN 325 MG PO TABS: 650.0000 mg | ORAL_TABLET | Freq: Four times a day (QID) | ORAL | Status: DC | PRN

## 2018-06-28 MED FILL — levETIRAcetam 500 MG TABS: 500 | 7 days supply | Qty: 14 | Fill #0

## 2018-06-28 MED FILL — oxyCODONE HCL 5 MG TABS: 5 | 4 days supply | Qty: 28 | Fill #0

## 2018-06-28 MED FILL — traMADol HCL 50 MG TABS: 50 | 5 days supply | Qty: 20 | Fill #0
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
## 2018-06-28 NOTE — Progress Notes (Signed)
Nurse was present when Trauma [facility] assessed patient this morning.   Patient not appropriate for CIR and will be discharged home on today with outside PT,OT, and speech.  PT & OT to see patient one more time.
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
## 2018-06-28 NOTE — Clinical Social Work Note (Signed)
Clinical Social Worker following patient in hopes of SBIRT completion prior to discharge.  Per therapies, patient is a Rancho VII emerging VIII and will actively engage in conversation now.  Unfortunately, patient with very little to no memory of day to day and plans to stay with family and follow up outpatient.  Patient mental status is not appropriate for SBIRT completion at this time.  Patient family and medical staff do not have concerns with current substance use.  CSW remains available for support as needed.  Macario GoldsJesse Lakelynn Severtson, [place] [phone]

## 2018-06-28 NOTE — Care Management Note (Signed)
Case Management Note  Patient Details  Name: Matthew Young MRN: [id number] Date of Birth: 1996-05-04  Subjective/Objective:                    Action/Plan: Pt discharging home today. Outpatient therapies already set up by previous CM. Pt with orders for 3 in 1. [facility] and will have DME delivered to the room.  Family with paperwork for FMLA. CM spoke to Dr Janee Mornhompson. He asked that paper work be taken to trauma office. CSW available and assisting with this process.  Family to provide transportation home.   Expected Discharge Date:  06/28/18               Expected Discharge Plan:  OP Rehab  In-House Referral:  Clinical Social Work  Discharge planning Services  CM Consult  Post Acute Care Choice:    Choice offered to:     DME Arranged:    DME Agency:     HH Arranged:    HH Agency:     Status of Service:  Completed, signed off  If discussed at [employer] Meetings, dates discussed:    Additional Comments:  Kermit BaloKelli F Rockland Kotarski, RN 06/28/2018, 1:14 PM

## 2018-06-29 ENCOUNTER — Other Ambulatory Visit: Payer: Self-pay | Admitting: *Deleted

## 2018-06-29 ENCOUNTER — Encounter: Payer: Self-pay | Admitting: Family Medicine
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
## 2018-06-30 MED FILL — METHOCARBAMOL 500 MG TABS: 500 | 7 days supply | Qty: 20 | Fill #0

## 2018-07-05 ENCOUNTER — Encounter: Payer: Self-pay | Admitting: Family Medicine

## 2018-07-05 ENCOUNTER — Ambulatory Visit (INDEPENDENT_AMBULATORY_CARE_PROVIDER_SITE_OTHER): Payer: 59 | Admitting: Family Medicine

## 2018-07-05 ENCOUNTER — Other Ambulatory Visit: Payer: Self-pay

## 2018-07-05 ENCOUNTER — Ambulatory Visit: Payer: 59 | Attending: General Surgery | Admitting: Speech Pathology

## 2018-07-05 VITALS — BP 146/94 | HR 90 | Temp 98.8°F | Resp 12 | Ht 70.0 in | Wt 167.0 lb

## 2018-07-05 DIAGNOSIS — S27322A Contusion of lung, bilateral, initial encounter: Secondary | ICD-10-CM

## 2018-07-05 DIAGNOSIS — K5903 Drug induced constipation: Secondary | ICD-10-CM

## 2018-07-05 DIAGNOSIS — R41841 Cognitive communication deficit: Secondary | ICD-10-CM | POA: Insufficient documentation

## 2018-07-05 DIAGNOSIS — R52 Pain, unspecified: Secondary | ICD-10-CM | POA: Diagnosis not present

## 2018-07-05 DIAGNOSIS — T402X5A Adverse effect of other opioids, initial encounter: Secondary | ICD-10-CM

## 2018-07-05 DIAGNOSIS — T07XXXA Unspecified multiple injuries, initial encounter: Secondary | ICD-10-CM

## 2018-07-05 DIAGNOSIS — Z09 Encounter for follow-up examination after completed treatment for conditions other than malignant neoplasm: Secondary | ICD-10-CM | POA: Diagnosis not present

## 2018-07-05 MED ORDER — TRAMADOL HCL 50 MG PO TABS: 50.0000 mg | ORAL_TABLET | Freq: Three times a day (TID) | ORAL | 0 refills | Status: DC | PRN

## 2018-07-05 MED FILL — traMADol HCL 50 MG TABS: 50 | 33 days supply | Qty: 100 | Fill #0

## 2018-07-05 NOTE — Addendum Note (Signed)
Addended by: Arlana LindauBARDIN, Mccayla Shimada E on: 07/05/2018 03:06 PM   Modules accepted: Orders
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
## 2018-07-05 NOTE — Patient Instructions (Signed)
Get a planner or calendar to begin recording details of your day.   Memory Compensation Strategies  1. Use "WARM" strategy. W= write it down A=  associate it R=  repeat it M=  make a mental picture  2. You can keep a Glass blower/designerMemory Notebook. Use a 3-ring notebook with sections for the following:  calendar, important names and phone numbers, medications, doctors' names/phone numbers, "to do list"/reminders, and a section to journal what you did each day  3. Use a calendar to write appointments down.  4. Write yourself a schedule for the day.  This can be placed on the calendar or in a separate section of the Memory Notebook.  Keeping a regular schedule can help memory.  5. Use medication organizer with sections for each day or morning/evening pills  You may need help loading it  6. Keep a basket, or pegboard by the door.   Place items that you need to take out with you in the basket or on the pegboard.  You may also want to include a message board for reminders.  7. Use sticky notes. Place sticky notes with reminders in a place where the task is performed.  For example:  "turn off the stove" placed by the stove, "lock the door" placed on the door at eye level, "take your medications" on the bathroom mirror or by the place where you normally take your medications  8. Use alarms/timers.  Use while cooking to remind yourself to check on food or as a reminder to take your medicine, or as a reminder to make a call, or as a reminder to perform another task, etc.  9. Use a small tape recorder to record important information and notes for yourself.

## 2018-07-06 ENCOUNTER — Encounter: Payer: Self-pay | Admitting: *Deleted

## 2018-07-06 LAB — CBC WITH DIFFERENTIAL/PLATELET
Basophils Absolute: 43 cells/uL (ref 0–200)
Basophils Relative: 0.5 %
Eosinophils Absolute: 172 cells/uL (ref 15–500)
Eosinophils Relative: 2 %
HCT: 40.1 % (ref 38.5–50.0)
Hemoglobin: 13.6 g/dL (ref 13.2–17.1)
Lymphs Abs: 2073 cells/uL (ref 850–3900)
MCH: 30.9 pg (ref 27.0–33.0)
MCHC: 33.9 g/dL (ref 32.0–36.0)
MCV: 91.1 fL (ref 80.0–100.0)
MPV: 10.1 fL (ref 7.5–12.5)
Monocytes Relative: 4.9 %
Neutro Abs: 5891 cells/uL (ref 1500–7800)
Neutrophils Relative %: 68.5 %
Platelets: 419 10*3/uL — ABNORMAL HIGH (ref 140–400)
RBC: 4.4 10*6/uL (ref 4.20–5.80)
RDW: 12.2 % (ref 11.0–15.0)
Total Lymphocyte: 24.1 %
WBC mixed population: 421 cells/uL (ref 200–950)
WBC: 8.6 10*3/uL (ref 3.8–10.8)

## 2018-07-06 LAB — COMPLETE METABOLIC PANEL WITH GFR
AG Ratio: 2.1 (calc) (ref 1.0–2.5)
ALT: 34 U/L (ref 9–46)
AST: 28 U/L (ref 10–40)
Albumin: 4.9 g/dL (ref 3.6–5.1)
Alkaline phosphatase (APISO): 73 U/L (ref 40–115)
BUN: 17 mg/dL (ref 7–25)
CO2: 30 mmol/L (ref 20–32)
Calcium: 10.4 mg/dL — ABNORMAL HIGH (ref 8.6–10.3)
Chloride: 101 mmol/L (ref 98–110)
Creat: 1.01 mg/dL (ref 0.60–1.35)
GFR, Est African American: 121 mL/min/{1.73_m2} (ref >=60)
GFR, Est Non African American: 104 mL/min/{1.73_m2} (ref >=60)
Globulin: 2.3 g/dL (calc) (ref 1.9–3.7)
Glucose, Bld: 95 mg/dL (ref 65–99)
Potassium: 4.3 mmol/L (ref 3.5–5.3)
Sodium: 141 mmol/L (ref 135–146)
Total Bilirubin: 0.5 mg/dL (ref 0.2–1.2)
Total Protein: 7.2 g/dL (ref 6.1–8.1)

## 2018-07-07 ENCOUNTER — Other Ambulatory Visit: Payer: Self-pay | Admitting: Physician Assistant

## 2018-07-07 DIAGNOSIS — I609 Nontraumatic subarachnoid hemorrhage, unspecified: Secondary | ICD-10-CM

## 2018-07-08 ENCOUNTER — Other Ambulatory Visit (HOSPITAL_COMMUNITY): Payer: Self-pay | Admitting: Physician Assistant

## 2018-07-08 DIAGNOSIS — I609 Nontraumatic subarachnoid hemorrhage, unspecified: Secondary | ICD-10-CM

## 2018-07-11 ENCOUNTER — Ambulatory Visit: Payer: 59 | Admitting: Occupational Therapy

## 2018-07-11 ENCOUNTER — Ambulatory Visit: Payer: 59 | Attending: General Surgery | Admitting: Physical Therapy

## 2018-07-11 ENCOUNTER — Other Ambulatory Visit: Payer: Self-pay

## 2018-07-11 ENCOUNTER — Encounter: Payer: Self-pay | Admitting: Occupational Therapy

## 2018-07-11 ENCOUNTER — Encounter: Payer: Self-pay | Admitting: Physical Therapy

## 2018-07-11 DIAGNOSIS — M6281 Muscle weakness (generalized): Secondary | ICD-10-CM | POA: Diagnosis not present

## 2018-07-11 DIAGNOSIS — R41840 Attention and concentration deficit: Secondary | ICD-10-CM

## 2018-07-11 DIAGNOSIS — R6889 Other general symptoms and signs: Secondary | ICD-10-CM | POA: Insufficient documentation

## 2018-07-11 DIAGNOSIS — R41841 Cognitive communication deficit: Secondary | ICD-10-CM | POA: Diagnosis not present

## 2018-07-11 DIAGNOSIS — R2689 Other abnormalities of gait and mobility: Secondary | ICD-10-CM | POA: Diagnosis not present

## 2018-07-11 DIAGNOSIS — M25512 Pain in left shoulder: Secondary | ICD-10-CM | POA: Insufficient documentation

## 2018-07-11 DIAGNOSIS — M25521 Pain in right elbow: Secondary | ICD-10-CM | POA: Diagnosis not present
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
## 2018-07-12 ENCOUNTER — Ambulatory Visit (HOSPITAL_COMMUNITY)
Admission: RE | Admit: 2018-07-12 | Discharge: 2018-07-12 | Disposition: A | Discharge status: Home / Self Care | Payer: 59 | Source: Ambulatory Visit | Attending: Physician Assistant | Admitting: Physician Assistant

## 2018-07-12 ENCOUNTER — Encounter (HOSPITAL_COMMUNITY): Payer: Self-pay

## 2018-07-12 DIAGNOSIS — I609 Nontraumatic subarachnoid hemorrhage, unspecified: Secondary | ICD-10-CM | POA: Insufficient documentation

## 2018-07-12 DIAGNOSIS — X58XXXA Exposure to other specified factors, initial encounter: Secondary | ICD-10-CM | POA: Diagnosis not present

## 2018-07-12 DIAGNOSIS — S0003XA Contusion of scalp, initial encounter: Secondary | ICD-10-CM | POA: Insufficient documentation

## 2018-07-13 DIAGNOSIS — I609 Nontraumatic subarachnoid hemorrhage, unspecified: Secondary | ICD-10-CM | POA: Diagnosis not present

## 2018-07-14 ENCOUNTER — Ambulatory Visit: Payer: 59 | Admitting: Speech Pathology

## 2018-07-14 DIAGNOSIS — R41841 Cognitive communication deficit: Secondary | ICD-10-CM

## 2018-07-14 DIAGNOSIS — M25512 Pain in left shoulder: Secondary | ICD-10-CM | POA: Diagnosis not present

## 2018-07-14 DIAGNOSIS — R6889 Other general symptoms and signs: Secondary | ICD-10-CM | POA: Diagnosis not present

## 2018-07-14 DIAGNOSIS — M6281 Muscle weakness (generalized): Secondary | ICD-10-CM | POA: Diagnosis not present

## 2018-07-14 DIAGNOSIS — M25521 Pain in right elbow: Secondary | ICD-10-CM | POA: Diagnosis not present

## 2018-07-14 DIAGNOSIS — R41840 Attention and concentration deficit: Secondary | ICD-10-CM | POA: Diagnosis not present

## 2018-07-14 DIAGNOSIS — R2689 Other abnormalities of gait and mobility: Secondary | ICD-10-CM | POA: Diagnosis not present
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
## 2018-07-14 NOTE — Patient Instructions (Signed)
   Cognitive Activities you can do at home:   - Solitaire  - Majong  - Scrabble  - Chess/Checkers  - Crosswords (easy level)  - Education officer, communityUno  - Card Games  - Board Games  - Connect 4  - Simon  - the Memory Game  - Dominoes  - Backgammon  - jig saws (hard)  On your computer, tablet or phone: [employer] Memory Match Game App [employer] Hour/unblock my car [place] [employer]   Tips to help facilitate better attention, concentration, focus   Do harder, longer tasks when you are most alert/awake  Break down larger tasks into small parts  Limit distractions of TV, radio, conversation, e mails/texts, appliance noise, etc - if a job is important, do it in a quiet room  Be aware of how you are functioning in high stimulation environments such as large stores, parties, restaurants - any place with lots of lights, noise, signs etc  Group conversations may be more difficult to process than one on one conversations  Give yourself extra time to process conversation, reading materials, directions or information from your healthcare providers  Organization is key - clutters of laundry, mail, paperwork, dirty dishes - all make it more difficult to concentrate  Before you start a task, have all the needed supplies, directions, recipes ready and organized. This way you don't have to go looking for something in the middle of a task and become distracted.   Be aware of fatigue - take rests or breaks when needed to re-group and re-focus  Use while cooking to remind yourself to check on food or as a reminder to take your medicine, or as a reminder to make a call, or as a reminder to perform another task, etc.   Memory Strategies  W - Write it down  A - Associate it with something  R - Repeat it  M - Mental Image     Play the memory game  Try to remember 3-5 items on your store list without looking  Study a detailed picture in a magazine for 1 minute, then write down everything you can  remember from the picture

## 2018-07-15 ENCOUNTER — Encounter

## 2018-07-18 ENCOUNTER — Ambulatory Visit: Payer: 59 | Admitting: Speech Pathology

## 2018-07-18 DIAGNOSIS — M25512 Pain in left shoulder: Secondary | ICD-10-CM | POA: Diagnosis not present

## 2018-07-18 DIAGNOSIS — M6281 Muscle weakness (generalized): Secondary | ICD-10-CM | POA: Diagnosis not present

## 2018-07-18 DIAGNOSIS — M25521 Pain in right elbow: Secondary | ICD-10-CM | POA: Diagnosis not present

## 2018-07-18 DIAGNOSIS — R41841 Cognitive communication deficit: Secondary | ICD-10-CM | POA: Diagnosis not present

## 2018-07-18 DIAGNOSIS — R41840 Attention and concentration deficit: Secondary | ICD-10-CM | POA: Diagnosis not present

## 2018-07-18 DIAGNOSIS — R6889 Other general symptoms and signs: Secondary | ICD-10-CM | POA: Diagnosis not present

## 2018-07-18 DIAGNOSIS — R2689 Other abnormalities of gait and mobility: Secondary | ICD-10-CM | POA: Diagnosis not present
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
## 2018-07-18 NOTE — Patient Instructions (Signed)

## 2018-07-21 ENCOUNTER — Ambulatory Visit: Payer: 59 | Admitting: Speech Pathology

## 2018-07-21 DIAGNOSIS — R2689 Other abnormalities of gait and mobility: Secondary | ICD-10-CM | POA: Diagnosis not present

## 2018-07-21 DIAGNOSIS — M25512 Pain in left shoulder: Secondary | ICD-10-CM | POA: Diagnosis not present

## 2018-07-21 DIAGNOSIS — M25521 Pain in right elbow: Secondary | ICD-10-CM | POA: Diagnosis not present

## 2018-07-21 DIAGNOSIS — R41840 Attention and concentration deficit: Secondary | ICD-10-CM | POA: Diagnosis not present

## 2018-07-21 DIAGNOSIS — R6889 Other general symptoms and signs: Secondary | ICD-10-CM | POA: Diagnosis not present

## 2018-07-21 DIAGNOSIS — R41841 Cognitive communication deficit: Secondary | ICD-10-CM

## 2018-07-21 DIAGNOSIS — M6281 Muscle weakness (generalized): Secondary | ICD-10-CM | POA: Diagnosis not present
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
## 2018-07-26 ENCOUNTER — Ambulatory Visit (INDEPENDENT_AMBULATORY_CARE_PROVIDER_SITE_OTHER): Payer: 59 | Admitting: Family Medicine

## 2018-07-26 ENCOUNTER — Encounter: Payer: Self-pay | Admitting: Family Medicine

## 2018-07-26 VITALS — BP 130/76 | HR 74 | Temp 97.9°F | Resp 14 | Ht 70.0 in | Wt 167.0 lb

## 2018-07-26 DIAGNOSIS — S27322A Contusion of lung, bilateral, initial encounter: Secondary | ICD-10-CM

## 2018-07-26 DIAGNOSIS — S06309D Unspecified focal traumatic brain injury with loss of consciousness of unspecified duration, subsequent encounter: Secondary | ICD-10-CM | POA: Diagnosis not present

## 2018-07-26 DIAGNOSIS — T07XXXA Unspecified multiple injuries, initial encounter: Secondary | ICD-10-CM | POA: Diagnosis not present
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
## 2018-07-28 ENCOUNTER — Encounter: Payer: Self-pay | Admitting: Occupational Therapy

## 2018-07-28 ENCOUNTER — Ambulatory Visit: Payer: 59 | Admitting: Physical Therapy

## 2018-07-28 ENCOUNTER — Ambulatory Visit: Payer: 59 | Admitting: Occupational Therapy

## 2018-07-28 ENCOUNTER — Encounter: Payer: Self-pay | Admitting: Physical Therapy

## 2018-07-28 ENCOUNTER — Encounter: Payer: Self-pay | Admitting: Speech Pathology

## 2018-07-28 ENCOUNTER — Ambulatory Visit: Payer: 59 | Admitting: Speech Pathology

## 2018-07-28 DIAGNOSIS — M25521 Pain in right elbow: Secondary | ICD-10-CM | POA: Diagnosis not present

## 2018-07-28 DIAGNOSIS — R41840 Attention and concentration deficit: Secondary | ICD-10-CM

## 2018-07-28 DIAGNOSIS — R6889 Other general symptoms and signs: Secondary | ICD-10-CM

## 2018-07-28 DIAGNOSIS — M25512 Pain in left shoulder: Secondary | ICD-10-CM

## 2018-07-28 DIAGNOSIS — R2689 Other abnormalities of gait and mobility: Secondary | ICD-10-CM | POA: Diagnosis not present

## 2018-07-28 DIAGNOSIS — R41841 Cognitive communication deficit: Secondary | ICD-10-CM

## 2018-07-28 DIAGNOSIS — M6281 Muscle weakness (generalized): Secondary | ICD-10-CM | POA: Diagnosis not present

## 2018-07-28 NOTE — Patient Instructions (Signed)
In sitting hold ball, pressure through ball and raise overhead painfree ROM.  20 reps x2
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
## 2018-07-28 NOTE — Patient Instructions (Signed)
  Continue to use strategies of writing down tasks at work, using notebook  Continue to use notebook for important phone calls  It's OK to ask people to slow down when they are talking or letting a group conversation know to slow down  You are doing a great job using strategies to help your memory and attention  Restate what you heard to make sure you got it all when topic is important - doctor's appointment, work Therapist, musicstuff, Community education officerinsurance, financial  When working, reduce conversation as much as possible to keep you safe   Listen to your body - you may have some more fatigue as you start back to work, you may need to rest a lot after work - let your wife and family know you need to recover in the evening and likely on the weekends to start out
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
## 2018-08-01 ENCOUNTER — Encounter: Payer: 59 | Admitting: Speech Pathology

## 2018-08-01 ENCOUNTER — Ambulatory Visit: Payer: 59 | Admitting: Physical Therapy

## 2018-08-01 ENCOUNTER — Ambulatory Visit: Payer: 59 | Admitting: Occupational Therapy

## 2018-08-01 DIAGNOSIS — R6889 Other general symptoms and signs: Secondary | ICD-10-CM | POA: Diagnosis not present

## 2018-08-01 DIAGNOSIS — M6281 Muscle weakness (generalized): Secondary | ICD-10-CM

## 2018-08-01 DIAGNOSIS — M25521 Pain in right elbow: Secondary | ICD-10-CM | POA: Diagnosis not present

## 2018-08-01 DIAGNOSIS — R2689 Other abnormalities of gait and mobility: Secondary | ICD-10-CM | POA: Diagnosis not present

## 2018-08-01 DIAGNOSIS — R41841 Cognitive communication deficit: Secondary | ICD-10-CM | POA: Diagnosis not present

## 2018-08-01 DIAGNOSIS — R41840 Attention and concentration deficit: Secondary | ICD-10-CM | POA: Diagnosis not present

## 2018-08-01 DIAGNOSIS — M25512 Pain in left shoulder: Secondary | ICD-10-CM | POA: Diagnosis not present

## 2018-08-01 NOTE — Patient Instructions (Signed)
Scapular Retraction (Prone)    Lie with arms at sides. Pinch shoulder blades together and raise arms a few inches from floor. Repeat _10___ times per set. Do _1___ sets per session. Do __2__ sessions per day.   Extension - Prone (Dumbbell)    Lie with left arm hanging off side of bed. Lift hand back and up. Repeat _10___ times per set. Do __1__ sets per session. Do __2__ sessions per day.   Wall Push-Up    With feet and hands shoulder-width apart, lean into wall, then push away from wall. Repeat _10___ times. Do __2__ sessions per day.  EXTENSION: Standing - Resistance Band: Unstable (Active)     Against red resistance band, draw BOTH arms backward, as far as possible, keeping elbow straight. Complete _1__ sets of _10__ repetitions. Perform _2__ sessions per day.    (Clinic) Retraction: Row - Bilateral (Pulley)    Facing pulley, arms reaching forward, pull hands toward stomach, pinching shoulder blades together. Repeat __10__ times per set. Do __1__ sets per session. Do __2__ sessions per day.
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
## 2018-08-04 ENCOUNTER — Encounter: Payer: Self-pay | Admitting: Occupational Therapy

## 2018-08-04 ENCOUNTER — Ambulatory Visit: Payer: 59 | Admitting: Occupational Therapy

## 2018-08-04 ENCOUNTER — Encounter: Payer: 59 | Admitting: Speech Pathology

## 2018-08-04 DIAGNOSIS — M25512 Pain in left shoulder: Secondary | ICD-10-CM

## 2018-08-04 DIAGNOSIS — M6281 Muscle weakness (generalized): Secondary | ICD-10-CM

## 2018-08-04 DIAGNOSIS — R2689 Other abnormalities of gait and mobility: Secondary | ICD-10-CM | POA: Diagnosis not present

## 2018-08-04 DIAGNOSIS — R41841 Cognitive communication deficit: Secondary | ICD-10-CM | POA: Diagnosis not present

## 2018-08-04 DIAGNOSIS — R41840 Attention and concentration deficit: Secondary | ICD-10-CM

## 2018-08-04 DIAGNOSIS — R6889 Other general symptoms and signs: Secondary | ICD-10-CM | POA: Diagnosis not present

## 2018-08-04 DIAGNOSIS — M25521 Pain in right elbow: Secondary | ICD-10-CM | POA: Diagnosis not present

## 2018-08-04 NOTE — Patient Instructions (Signed)
Adjust your home program:  1. Add a 2 pound weight to the one where you lay on your belly. 2. Increase reps to 12 reps. 3. Do 2 sets back to back with at least a 30 second rest in between 4. Do 1 set a day instead of 2.
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
## 2018-08-10 ENCOUNTER — Ambulatory Visit: Payer: 59 | Admitting: Physical Therapy

## 2018-08-11 ENCOUNTER — Encounter: Payer: 59 | Admitting: Speech Pathology

## 2018-08-12 ENCOUNTER — Ambulatory Visit: Payer: 59 | Attending: General Surgery | Admitting: Occupational Therapy

## 2018-08-12 ENCOUNTER — Encounter: Payer: Self-pay | Admitting: Occupational Therapy

## 2018-08-12 DIAGNOSIS — M25512 Pain in left shoulder: Secondary | ICD-10-CM | POA: Insufficient documentation

## 2018-08-12 DIAGNOSIS — R41840 Attention and concentration deficit: Secondary | ICD-10-CM | POA: Diagnosis not present

## 2018-08-12 DIAGNOSIS — M6281 Muscle weakness (generalized): Secondary | ICD-10-CM | POA: Insufficient documentation

## 2018-08-12 DIAGNOSIS — M25521 Pain in right elbow: Secondary | ICD-10-CM | POA: Insufficient documentation
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
## 2018-08-15 ENCOUNTER — Encounter: Payer: 59 | Admitting: Speech Pathology

## 2018-08-15 ENCOUNTER — Ambulatory Visit: Payer: 59 | Admitting: Physical Therapy

## 2018-08-16 ENCOUNTER — Ambulatory Visit: Payer: 59 | Admitting: Occupational Therapy

## 2018-08-16 ENCOUNTER — Encounter: Payer: Self-pay | Admitting: Occupational Therapy

## 2018-08-16 DIAGNOSIS — M25521 Pain in right elbow: Secondary | ICD-10-CM

## 2018-08-16 DIAGNOSIS — M6281 Muscle weakness (generalized): Secondary | ICD-10-CM

## 2018-08-16 DIAGNOSIS — M25512 Pain in left shoulder: Secondary | ICD-10-CM
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
## 2018-08-18 ENCOUNTER — Encounter: Payer: 59 | Admitting: Speech Pathology

## 2018-08-18 ENCOUNTER — Ambulatory Visit: Payer: 59 | Admitting: Physical Therapy

## 2018-08-22 ENCOUNTER — Encounter: Payer: 59 | Admitting: Speech Pathology

## 2018-08-22 ENCOUNTER — Encounter: Payer: 59 | Admitting: Occupational Therapy

## 2018-08-22 ENCOUNTER — Ambulatory Visit: Payer: 59 | Admitting: Physical Therapy

## 2018-08-24 ENCOUNTER — Encounter: Payer: 59 | Admitting: Occupational Therapy

## 2018-08-25 ENCOUNTER — Encounter: Payer: 59 | Admitting: Speech Pathology

## 2018-08-25 ENCOUNTER — Encounter: Payer: 59 | Admitting: Occupational Therapy

## 2018-08-25 ENCOUNTER — Ambulatory Visit: Payer: 59 | Admitting: Physical Therapy

## 2018-08-29 ENCOUNTER — Encounter: Payer: 59 | Admitting: Speech Pathology

## 2018-08-30 ENCOUNTER — Encounter: Payer: 59 | Admitting: Occupational Therapy

## 2018-09-01 ENCOUNTER — Encounter: Payer: 59 | Admitting: Speech Pathology

## 2018-09-05 ENCOUNTER — Encounter: Payer: 59 | Admitting: Speech Pathology

## 2018-09-06 ENCOUNTER — Ambulatory Visit (INDEPENDENT_AMBULATORY_CARE_PROVIDER_SITE_OTHER): Payer: 59 | Admitting: Family Medicine

## 2018-09-06 ENCOUNTER — Encounter: Payer: Self-pay | Admitting: Family Medicine

## 2018-09-06 VITALS — BP 158/70 | HR 56 | Temp 97.9°F | Resp 16 | Ht 70.0 in | Wt 172.0 lb

## 2018-09-06 DIAGNOSIS — M25512 Pain in left shoulder: Secondary | ICD-10-CM | POA: Diagnosis not present

## 2018-09-06 DIAGNOSIS — Z23 Encounter for immunization: Secondary | ICD-10-CM | POA: Diagnosis not present
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
## 2019-08-22 ENCOUNTER — Encounter: Payer: 59 | Admitting: Family Medicine

## 2019-09-06 ENCOUNTER — Other Ambulatory Visit: Payer: Self-pay

## 2019-09-06 ENCOUNTER — Ambulatory Visit
Admission: EM | Admit: 2019-09-06 | Discharge: 2019-09-06 | Disposition: A | Discharge status: Home / Self Care | Payer: 59 | Attending: Family Medicine | Admitting: Family Medicine

## 2019-09-06 DIAGNOSIS — X509XXA Other and unspecified overexertion or strenuous movements or postures, initial encounter: Secondary | ICD-10-CM

## 2019-09-06 DIAGNOSIS — M545 Low back pain: Secondary | ICD-10-CM | POA: Diagnosis not present

## 2019-09-06 DIAGNOSIS — S39012A Strain of muscle, fascia and tendon of lower back, initial encounter: Secondary | ICD-10-CM

## 2019-09-06 MED ORDER — MELOXICAM 7.5 MG PO TABS: 7.5000 mg | ORAL_TABLET | Freq: Every day | ORAL | 0 refills | Status: DC

## 2019-09-06 MED FILL — MELOXICAM 7.5 MG TABLET: 7.5 | 30 days supply | Qty: 30 | Fill #0
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
## 2019-09-06 NOTE — Discharge Instructions (Signed)
I believe that you have strained a muscle in the back Meloxicam for pain and inflammation.  Stretching, ice for a few days and then heat. Massage can help Follow up as needed for continued or worsening symptoms

## 2019-09-06 NOTE — ED Triage Notes (Signed)
Pt was moving equipment this morning and heard a pop. Has pain in right lower back with  right leg weakness

## 2019-09-07 ENCOUNTER — Ambulatory Visit: Payer: 59 | Admitting: Family Medicine

## 2019-09-14 ENCOUNTER — Encounter: Payer: 59 | Admitting: Family Medicine

## 2019-10-20 ENCOUNTER — Encounter: Payer: 59 | Admitting: Family Medicine

## 2020-01-13 ENCOUNTER — Ambulatory Visit
Admission: EM | Admit: 2020-01-13 | Discharge: 2020-01-13 | Disposition: A | Discharge status: Home / Self Care | Payer: 59

## 2020-01-13 ENCOUNTER — Other Ambulatory Visit: Payer: Self-pay

## 2020-01-13 DIAGNOSIS — S39012A Strain of muscle, fascia and tendon of lower back, initial encounter: Secondary | ICD-10-CM

## 2020-01-13 DIAGNOSIS — W19XXXA Unspecified fall, initial encounter: Secondary | ICD-10-CM
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
## 2020-01-13 NOTE — ED Triage Notes (Signed)
Pt was cutting large limb from tree earlier and when he attempted to pull it down he fell on large root, pain is in right lower area and he is unable to set down without increased pain

## 2020-01-13 NOTE — Discharge Instructions (Signed)
Continue conservative management of rest, ice, heat, and gentle stretches Continue to alternate ibuprofen and tylenol as needed Follow up with PCP if symptoms persist Return or go to the ER if you have any new or worsening symptoms (fever, chills, chest pain, abdominal pain, changes in bowel or bladder habits, pain radiating into lower legs, etc...)
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
## 2020-02-19 ENCOUNTER — Other Ambulatory Visit: Payer: Self-pay

## 2020-02-19 ENCOUNTER — Ambulatory Visit (INDEPENDENT_AMBULATORY_CARE_PROVIDER_SITE_OTHER): Payer: 59 | Admitting: Family Medicine

## 2020-02-19 ENCOUNTER — Encounter: Payer: Self-pay | Admitting: Family Medicine

## 2020-02-19 VITALS — BP 120/80 | HR 68 | Temp 97.2°F | Resp 16 | Ht 70.0 in | Wt 172.0 lb

## 2020-02-19 DIAGNOSIS — Z0000 Encounter for general adult medical examination without abnormal findings: Secondary | ICD-10-CM | POA: Diagnosis not present

## 2020-02-19 DIAGNOSIS — R6889 Other general symptoms and signs: Secondary | ICD-10-CM | POA: Diagnosis not present
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
## 2020-02-20 ENCOUNTER — Other Ambulatory Visit: Payer: 59

## 2020-02-20 DIAGNOSIS — R6889 Other general symptoms and signs: Secondary | ICD-10-CM | POA: Diagnosis not present

## 2020-02-20 DIAGNOSIS — Z0000 Encounter for general adult medical examination without abnormal findings: Secondary | ICD-10-CM | POA: Diagnosis not present

## 2020-02-21 LAB — CBC WITH DIFFERENTIAL/PLATELET
Absolute Monocytes: 343 cells/uL (ref 200–950)
Basophils Absolute: 39 cells/uL (ref 0–200)
Basophils Relative: 0.8 %
Eosinophils Absolute: 142 cells/uL (ref 15–500)
Eosinophils Relative: 2.9 %
HCT: 41.2 % (ref 38.5–50.0)
Hemoglobin: 13.9 g/dL (ref 13.2–17.1)
Lymphs Abs: 1916 cells/uL (ref 850–3900)
MCH: 31.2 pg (ref 27.0–33.0)
MCHC: 33.7 g/dL (ref 32.0–36.0)
MCV: 92.6 fL (ref 80.0–100.0)
MPV: 10.6 fL (ref 7.5–12.5)
Monocytes Relative: 7 %
Neutro Abs: 2460 cells/uL (ref 1500–7800)
Neutrophils Relative %: 50.2 %
Platelets: 256 10*3/uL (ref 140–400)
RBC: 4.45 10*6/uL (ref 4.20–5.80)
RDW: 11.9 % (ref 11.0–15.0)
Total Lymphocyte: 39.1 %
WBC: 4.9 10*3/uL (ref 3.8–10.8)

## 2020-02-21 LAB — LIPID PANEL
Cholesterol: 143 mg/dL (ref <200)
HDL: 45 mg/dL (ref >=40)
LDL Cholesterol (Calc): 86 mg/dL (calc)
Non-HDL Cholesterol (Calc): 98 mg/dL (calc) (ref <130)
Total CHOL/HDL Ratio: 3.2 (calc) (ref <5.0)
Triglycerides: 42 mg/dL (ref <150)

## 2020-02-21 LAB — COMPLETE METABOLIC PANEL WITH GFR
AG Ratio: 2.1 (calc) (ref 1.0–2.5)
ALT: 22 U/L (ref 9–46)
AST: 23 U/L (ref 10–40)
Albumin: 4.4 g/dL (ref 3.6–5.1)
Alkaline phosphatase (APISO): 48 U/L (ref 36–130)
BUN: 16 mg/dL (ref 7–25)
CO2: 28 mmol/L (ref 20–32)
Calcium: 9.8 mg/dL (ref 8.6–10.3)
Chloride: 105 mmol/L (ref 98–110)
Creat: 0.94 mg/dL (ref 0.60–1.35)
GFR, Est African American: 130 mL/min/{1.73_m2} (ref >=60)
GFR, Est Non African American: 112 mL/min/{1.73_m2} (ref >=60)
Globulin: 2.1 g/dL (calc) (ref 1.9–3.7)
Glucose, Bld: 93 mg/dL (ref 65–99)
Potassium: 4.2 mmol/L (ref 3.5–5.3)
Sodium: 140 mmol/L (ref 135–146)
Total Bilirubin: 0.5 mg/dL (ref 0.2–1.2)
Total Protein: 6.5 g/dL (ref 6.1–8.1)

## 2020-02-21 LAB — SARS COV-2 SEROLOGY(COVID-19)AB(IGG,IGM),IMMUNOASSAY
SARS CoV-2 AB IgG: NEGATIVE
SARS CoV-2 IgM: NEGATIVE

## 2020-04-24 DIAGNOSIS — Z20828 Contact with and (suspected) exposure to other viral communicable diseases: Secondary | ICD-10-CM | POA: Diagnosis not present

## 2020-04-24 DIAGNOSIS — Z03818 Encounter for observation for suspected exposure to other biological agents ruled out: Secondary | ICD-10-CM | POA: Diagnosis not present

## 2020-05-28 DIAGNOSIS — H52203 Unspecified astigmatism, bilateral: Secondary | ICD-10-CM | POA: Diagnosis not present

## 2020-05-28 DIAGNOSIS — H5213 Myopia, bilateral: Secondary | ICD-10-CM | POA: Diagnosis not present

## 2020-07-18 ENCOUNTER — Ambulatory Visit
Admission: EM | Admit: 2020-07-18 | Discharge: 2020-07-18 | Disposition: A | Discharge status: Home / Self Care | Payer: 59 | Attending: Emergency Medicine | Admitting: Emergency Medicine

## 2020-07-18 ENCOUNTER — Telehealth: Payer: 59 | Admitting: Nurse Practitioner

## 2020-07-18 ENCOUNTER — Other Ambulatory Visit: Payer: Self-pay

## 2020-07-18 DIAGNOSIS — Z1152 Encounter for screening for COVID-19: Secondary | ICD-10-CM

## 2020-07-18 DIAGNOSIS — R519 Headache, unspecified: Secondary | ICD-10-CM

## 2020-07-18 DIAGNOSIS — Z20822 Contact with and (suspected) exposure to covid-19: Secondary | ICD-10-CM

## 2020-07-18 NOTE — Discharge Instructions (Addendum)
COVID testing ordered.  It will take between 2-5 days for test results.  Someone will contact you regarding abnormal results.    In the meantime: You should remain isolated in your home for 10 days from symptom onset AND greater than 72 hours after symptoms resolution (absence of fever without the use of fever-reducing medication and improvement in respiratory symptoms), whichever is longer Get plenty of rest and push fluids Use OTC zyrtec for nasal congestion, runny nose, and/or sore throat Use OTC flonase for nasal congestion and runny nose Use medications daily for symptom relief Use OTC medications like ibuprofen or tylenol as needed fever or pain Call or go to the ED if you have any new or worsening symptoms such as fever, cough, shortness of breath, chest tightness, chest pain, turning blue, changes in mental status, etc...  
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
## 2020-07-18 NOTE — ED Triage Notes (Signed)
Pt presents with c/o headache and feeling unwell since wednesday

## 2020-07-19 LAB — SARS-COV-2, NAA 2 DAY TAT

## 2020-07-19 LAB — NOVEL CORONAVIRUS, NAA: SARS-CoV-2, NAA: NOT DETECTED

## 2021-04-21 ENCOUNTER — Ambulatory Visit: Payer: 59 | Admitting: Family Medicine

## 2021-05-15 ENCOUNTER — Other Ambulatory Visit: Payer: 59

## 2021-05-15 ENCOUNTER — Other Ambulatory Visit: Payer: Self-pay

## 2021-05-15 DIAGNOSIS — Z13220 Encounter for screening for lipoid disorders: Secondary | ICD-10-CM

## 2021-05-15 DIAGNOSIS — Z136 Encounter for screening for cardiovascular disorders: Secondary | ICD-10-CM

## 2021-05-15 DIAGNOSIS — Z1159 Encounter for screening for other viral diseases: Secondary | ICD-10-CM

## 2021-05-16 LAB — HEPATITIS C ANTIBODY
Hepatitis C Ab: NONREACTIVE
SIGNAL TO CUT-OFF: 0.41 (ref <1.00)

## 2021-05-16 LAB — CBC WITH DIFFERENTIAL/PLATELET
Absolute Monocytes: 389 cells/uL (ref 200–950)
Basophils Absolute: 32 cells/uL (ref 0–200)
Basophils Relative: 0.6 %
Eosinophils Absolute: 81 cells/uL (ref 15–500)
Eosinophils Relative: 1.5 %
HCT: 43.6 % (ref 38.5–50.0)
Hemoglobin: 14.9 g/dL (ref 13.2–17.1)
Lymphs Abs: 1901 cells/uL (ref 850–3900)
MCH: 31.6 pg (ref 27.0–33.0)
MCHC: 34.2 g/dL (ref 32.0–36.0)
MCV: 92.6 fL (ref 80.0–100.0)
MPV: 10.5 fL (ref 7.5–12.5)
Monocytes Relative: 7.2 %
Neutro Abs: 2997 cells/uL (ref 1500–7800)
Neutrophils Relative %: 55.5 %
Platelets: 255 10*3/uL (ref 140–400)
RBC: 4.71 10*6/uL (ref 4.20–5.80)
RDW: 11.9 % (ref 11.0–15.0)
Total Lymphocyte: 35.2 %
WBC: 5.4 10*3/uL (ref 3.8–10.8)

## 2021-05-16 LAB — LIPID PANEL
Cholesterol: 169 mg/dL (ref <200)
HDL: 45 mg/dL (ref >=40)
LDL Cholesterol (Calc): 109 mg/dL (calc) — ABNORMAL HIGH
Non-HDL Cholesterol (Calc): 124 mg/dL (calc) (ref <130)
Total CHOL/HDL Ratio: 3.8 (calc) (ref <5.0)
Triglycerides: 67 mg/dL (ref <150)

## 2021-05-16 LAB — COMPLETE METABOLIC PANEL WITH GFR
AG Ratio: 2.1 (calc) (ref 1.0–2.5)
ALT: 15 U/L (ref 9–46)
AST: 17 U/L (ref 10–40)
Albumin: 4.8 g/dL (ref 3.6–5.1)
Alkaline phosphatase (APISO): 42 U/L (ref 36–130)
BUN: 16 mg/dL (ref 7–25)
CO2: 29 mmol/L (ref 20–32)
Calcium: 10.2 mg/dL (ref 8.6–10.3)
Chloride: 101 mmol/L (ref 98–110)
Creat: 0.99 mg/dL (ref 0.60–1.35)
GFR, Est African American: 121 mL/min/{1.73_m2} (ref >=60)
GFR, Est Non African American: 105 mL/min/{1.73_m2} (ref >=60)
Globulin: 2.3 g/dL (calc) (ref 1.9–3.7)
Glucose, Bld: 86 mg/dL (ref 65–99)
Potassium: 4.1 mmol/L (ref 3.5–5.3)
Sodium: 140 mmol/L (ref 135–146)
Total Bilirubin: 0.8 mg/dL (ref 0.2–1.2)
Total Protein: 7.1 g/dL (ref 6.1–8.1)

## 2021-05-19 ENCOUNTER — Other Ambulatory Visit: Payer: Self-pay

## 2021-05-19 ENCOUNTER — Encounter: Payer: Self-pay | Admitting: Family Medicine

## 2021-05-19 ENCOUNTER — Ambulatory Visit (INDEPENDENT_AMBULATORY_CARE_PROVIDER_SITE_OTHER): Payer: 59 | Admitting: Family Medicine

## 2021-05-19 VITALS — BP 108/64 | HR 78 | Temp 98.2°F | Resp 14 | Ht 70.0 in | Wt 172.0 lb

## 2021-05-19 DIAGNOSIS — Z0000 Encounter for general adult medical examination without abnormal findings: Secondary | ICD-10-CM
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
## 2021-11-05 DIAGNOSIS — Z20822 Contact with and (suspected) exposure to covid-19: Secondary | ICD-10-CM | POA: Diagnosis not present

## 2021-11-05 DIAGNOSIS — Z20828 Contact with and (suspected) exposure to other viral communicable diseases: Secondary | ICD-10-CM | POA: Diagnosis not present

## 2022-07-10 ENCOUNTER — Encounter: Payer: 59 | Admitting: Family Medicine

## 2022-07-31 ENCOUNTER — Ambulatory Visit (INDEPENDENT_AMBULATORY_CARE_PROVIDER_SITE_OTHER): Payer: 59 | Admitting: Family Medicine

## 2022-07-31 VITALS — BP 112/56 | HR 67 | Temp 98.4°F | Ht 70.0 in | Wt 170.4 lb

## 2022-07-31 DIAGNOSIS — Z0000 Encounter for general adult medical examination without abnormal findings: Secondary | ICD-10-CM
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
## 2022-08-01 LAB — COMPLETE METABOLIC PANEL WITH GFR
AG Ratio: 2.3 (calc) (ref 1.0–2.5)
ALT: 17 U/L (ref 9–46)
AST: 19 U/L (ref 10–40)
Albumin: 4.8 g/dL (ref 3.6–5.1)
Alkaline phosphatase (APISO): 44 U/L (ref 36–130)
BUN: 12 mg/dL (ref 7–25)
CO2: 28 mmol/L (ref 20–32)
Calcium: 9.9 mg/dL (ref 8.6–10.3)
Chloride: 104 mmol/L (ref 98–110)
Creat: 1.15 mg/dL (ref 0.60–1.24)
Globulin: 2.1 g/dL (calc) (ref 1.9–3.7)
Glucose, Bld: 85 mg/dL (ref 65–99)
Potassium: 4.3 mmol/L (ref 3.5–5.3)
Sodium: 142 mmol/L (ref 135–146)
Total Bilirubin: 0.7 mg/dL (ref 0.2–1.2)
Total Protein: 6.9 g/dL (ref 6.1–8.1)
eGFR: 89 mL/min/{1.73_m2} (ref >=60)

## 2022-08-01 LAB — LIPID PANEL
Cholesterol: 153 mg/dL (ref <200)
HDL: 55 mg/dL (ref >=40)
LDL Cholesterol (Calc): 84 mg/dL (calc)
Non-HDL Cholesterol (Calc): 98 mg/dL (calc) (ref <130)
Total CHOL/HDL Ratio: 2.8 (calc) (ref <5.0)
Triglycerides: 49 mg/dL (ref <150)

## 2022-08-01 LAB — CBC WITH DIFFERENTIAL/PLATELET
Absolute Monocytes: 440 cells/uL (ref 200–950)
Basophils Absolute: 28 cells/uL (ref 0–200)
Basophils Relative: 0.5 %
Eosinophils Absolute: 99 cells/uL (ref 15–500)
Eosinophils Relative: 1.8 %
HCT: 43.7 % (ref 38.5–50.0)
Hemoglobin: 14.8 g/dL (ref 13.2–17.1)
Lymphs Abs: 1991 cells/uL (ref 850–3900)
MCH: 31.2 pg (ref 27.0–33.0)
MCHC: 33.9 g/dL (ref 32.0–36.0)
MCV: 92 fL (ref 80.0–100.0)
MPV: 10.2 fL (ref 7.5–12.5)
Monocytes Relative: 8 %
Neutro Abs: 2943 cells/uL (ref 1500–7800)
Neutrophils Relative %: 53.5 %
Platelets: 240 10*3/uL (ref 140–400)
RBC: 4.75 10*6/uL (ref 4.20–5.80)
RDW: 12.1 % (ref 11.0–15.0)
Total Lymphocyte: 36.2 %
WBC: 5.5 10*3/uL (ref 3.8–10.8)

## 2022-12-04 DIAGNOSIS — Z3009 Encounter for other general counseling and advice on contraception: Secondary | ICD-10-CM | POA: Diagnosis not present

## 2023-01-01 DIAGNOSIS — Z302 Encounter for sterilization: Secondary | ICD-10-CM | POA: Diagnosis not present

## 2023-08-11 ENCOUNTER — Emergency Department (HOSPITAL_COMMUNITY)
Admission: EM | Admit: 2023-08-11 | Discharge: 2023-08-11 | Disposition: A | Discharge status: Home / Self Care | Payer: 59 | Attending: Emergency Medicine | Admitting: Emergency Medicine

## 2023-08-11 ENCOUNTER — Other Ambulatory Visit: Payer: Self-pay

## 2023-08-11 ENCOUNTER — Emergency Department (HOSPITAL_COMMUNITY): Payer: 59

## 2023-08-11 ENCOUNTER — Encounter (HOSPITAL_COMMUNITY): Payer: Self-pay

## 2023-08-11 DIAGNOSIS — R202 Paresthesia of skin: Secondary | ICD-10-CM | POA: Diagnosis not present

## 2023-08-11 DIAGNOSIS — R200 Anesthesia of skin: Secondary | ICD-10-CM | POA: Diagnosis not present

## 2023-08-11 DIAGNOSIS — R0789 Other chest pain: Secondary | ICD-10-CM | POA: Insufficient documentation

## 2023-08-11 DIAGNOSIS — R079 Chest pain, unspecified: Secondary | ICD-10-CM

## 2023-08-11 LAB — CBC
HCT: 45.1 % (ref 39.0–52.0)
Hemoglobin: 15.1 g/dL (ref 13.0–17.0)
MCH: 30.9 pg (ref 26.0–34.0)
MCHC: 33.5 g/dL (ref 30.0–36.0)
MCV: 92.2 fL (ref 80.0–100.0)
Platelets: 254 10*3/uL (ref 150–400)
RBC: 4.89 MIL/uL (ref 4.22–5.81)
RDW: 11.8 % (ref 11.5–15.5)
WBC: 6 10*3/uL (ref 4.0–10.5)
nRBC: 0 % (ref 0.0–0.2)

## 2023-08-11 LAB — BASIC METABOLIC PANEL
Anion gap: 8 (ref 5–15)
BUN: 22 mg/dL — ABNORMAL HIGH (ref 6–20)
CO2: 29 mmol/L (ref 22–32)
Calcium: 9.4 mg/dL (ref 8.9–10.3)
Chloride: 100 mmol/L (ref 98–111)
Creatinine, Ser: 1.08 mg/dL (ref 0.61–1.24)
GFR, Estimated: >60 mL/min (ref >60)
Glucose, Bld: 104 mg/dL — ABNORMAL HIGH (ref 70–99)
Potassium: 3.5 mmol/L (ref 3.5–5.1)
Sodium: 137 mmol/L (ref 135–145)

## 2023-08-11 LAB — TROPONIN I (HIGH SENSITIVITY)
Troponin I (High Sensitivity): 3 ng/L (ref <18)
Troponin I (High Sensitivity): 2 ng/L (ref <18)

## 2023-08-11 NOTE — Discharge Instructions (Signed)
You are seen in the emergency department for an episode of chest pain along with some intermittent left arm numbness.  Your vitals were stable and we did blood work chest x-ray EKG that did not show any evidence of heart injury.  Please continue to monitor your symptoms and follow-up with your primary care.  Return to the emergency department if any worsening or concerning symptoms.

## 2023-08-11 NOTE — ED Provider Notes (Signed)
[name] EMERGENCY DEPARTMENT AT [facility] Provider Note   CSN: [id number] Arrival date & time: 08/11/23  1656     History {Add pertinent medical, surgical, social history, OB history to HPI:1} Chief Complaint  Patient presents with   Chest Pain    Matthew Young is a 27 y.o. male.  He said he was on the couch leaning on his left side when he moved to get up and experienced a sharp pain in the center of his chest.  He said it only lasted about a minute and resolved.  Since then though he has noticed some pins-and-needles feeling in his left arm.  Mostly on the inner arm and it comes and goes.  He said he was sweating a little bit when it happened.  He has not had any recurrence of the pain and he thinks he may have had something like In the past.  No history of cardiac disease no smoking no drugs.  He said he otherwise feels pretty well.  The history is provided by the patient.  Chest Pain Pain location:  Substernal area Pain quality: stabbing   Pain radiates to:  Does not radiate Pain severity:  Mild Onset quality:  Sudden Duration:  1 minute Timing:  Constant Progression:  Resolved Chronicity:  New Context: at rest   Relieved by:  None tried Worsened by:  Nothing Ineffective treatments:  None tried Associated symptoms: diaphoresis and numbness   Associated symptoms: no abdominal pain, no cough, no dizziness, no headache, no nausea, no shortness of breath, no vomiting and no weakness   Risk factors: male sex   Risk factors: no high cholesterol, no hypertension, no prior DVT/PE and no smoking        Home Medications Prior to Admission medications   Medication Sig Start Date End Date Taking? Authorizing Provider  b complex vitamins capsule Take 1 capsule by mouth daily. Patient not taking: Reported on 07/31/2022    [provider]  cholecalciferol (VITAMIN D3) 25 MCG (1000 UNIT) tablet Take 1,000 Units by mouth daily. Patient not taking: Reported on  07/31/2022    [provider]  vitamin C (ASCORBIC ACID) 500 MG tablet Take 500 mg by mouth daily. Patient not taking: Reported on 07/31/2022    [provider]      Allergies    Amoxicillin, Penicillin g, and Penicillins    Review of Systems   Review of Systems  Constitutional:  Positive for diaphoresis.  Respiratory:  Negative for cough and shortness of breath.   Cardiovascular:  Positive for chest pain.  Gastrointestinal:  Negative for abdominal pain, nausea and vomiting.  Neurological:  Positive for numbness. Negative for dizziness, weakness and headaches.    Physical Exam Updated Vital Signs BP (!) 143/97   Pulse (!) 58   Temp 98 F (36.7 C)   Resp 20   Ht 5\' 10"  (1.778 m)   Wt 77.1 kg   SpO2 100%   BMI 24.39 kg/m  Physical Exam Vitals and nursing note reviewed.  Constitutional:      General: He is not in acute distress.    Appearance: He is well-developed.  HENT:     Head: Normocephalic and atraumatic.  Eyes:     Conjunctiva/sclera: Conjunctivae normal.  Cardiovascular:     Rate and Rhythm: Normal rate and regular rhythm.     Heart sounds: Normal heart sounds. No murmur heard. Pulmonary:     Effort: Pulmonary effort is normal. No respiratory distress.  Breath sounds: Normal breath sounds.  Abdominal:     Palpations: Abdomen is soft.     Tenderness: There is no abdominal tenderness.  Musculoskeletal:        General: No swelling. Normal range of motion.     Cervical back: Neck supple.     Right lower leg: No tenderness. No edema.     Left lower leg: No tenderness. No edema.     Comments: Patient had good radial pulse on the left arm.  When I elevated his arm is pulse remained good and strong.  Skin:    General: Skin is warm and dry.     Capillary Refill: Capillary refill takes less than 2 seconds.  Neurological:     General: No focal deficit present.     Mental Status: He is alert.     ED Results / Procedures / Treatments    Labs (all labs ordered are listed, but only abnormal results are displayed) Labs Reviewed  BASIC METABOLIC PANEL - Abnormal; Notable for the following components:      Result Value   Glucose, Bld 104 (*)    BUN 22 (*)    All other components within normal limits  CBC  TROPONIN I (HIGH SENSITIVITY)  TROPONIN I (HIGH SENSITIVITY)    EKG EKG Interpretation Date/Time:  Wednesday August 11 2023 17:06:00 EDT Ventricular Rate:  62 PR Interval:  160 QRS Duration:  92 QT Interval:  382 QTC Calculation: 387 R Axis:   86  Text Interpretation: Normal sinus rhythm Normal ECG No previous ECGs available Confirmed by Meridee Score [phone]) on 08/11/2023 5:10:43 PM  Radiology DG Chest 2 View  Result Date: 08/11/2023 CLINICAL DATA:  Chest pain and left arm numbness. EXAM: CHEST - 2 VIEW COMPARISON:  June 22, 2018 FINDINGS: The heart size and mediastinal contours are within normal limits. Both lungs are clear. The visualized skeletal structures are unremarkable. IMPRESSION: No active cardiopulmonary disease. Electronically Signed   By: Aram Candela M.D.   On: 08/11/2023 18:30    Procedures Procedures  {Document cardiac monitor, telemetry assessment procedure when appropriate:1}  Medications Ordered in ED Medications - No data to display  ED Course/ Medical Decision Making/ A&P   {   Click here for ABCD2, HEART and other calculatorsREFRESH Note before signing :1}                              Medical Decision Making Amount and/or Complexity of Data Reviewed Labs: ordered. Radiology: ordered.   This patient complains of ***; this involves an extensive number of treatment Options and is a complaint that carries with it a high risk of complications and morbidity. The differential includes ***  I ordered, reviewed and interpreted labs, which included *** I ordered medication *** and reviewed PMP when indicated. I ordered imaging studies which included *** and I independently     visualized and interpreted imaging which showed *** Additional history obtained from *** Previous records obtained and reviewed *** I consulted *** and discussed lab and imaging findings and discussed disposition.  Cardiac monitoring reviewed, *** Social determinants considered, *** Critical Interventions: ***  After the interventions stated above, I reevaluated the patient and found *** Admission and further testing considered, ***   {Document critical care time when appropriate:1} {Document review of labs and clinical decision tools ie heart score, Chads2Vasc2 etc:1}  {Document your independent review of radiology images, and any outside records:1} {Document  your discussion with family members, caretakers, and with consultants:1} {Document social determinants of health affecting pt's care:1} {Document your decision making why or why not admission, treatments were needed:1} Final Clinical Impression(s) / ED Diagnoses Final diagnoses:  None    Rx / DC Orders ED Discharge Orders     None

## 2023-08-11 NOTE — ED Triage Notes (Addendum)
C/o sudden onset of centralized cp with left arm numbness and diaphoretic when Sitting on couch hour and half PTA.  Denies sob.  Ambulatory to triage

## 2023-10-14 ENCOUNTER — Encounter: Payer: Self-pay | Admitting: Family Medicine

## 2023-10-14 ENCOUNTER — Ambulatory Visit (INDEPENDENT_AMBULATORY_CARE_PROVIDER_SITE_OTHER): Payer: 59 | Admitting: Family Medicine

## 2023-10-14 VITALS — BP 128/82 | HR 69 | Temp 98.1°F | Ht 70.0 in | Wt 166.4 lb

## 2023-10-14 DIAGNOSIS — Z0000 Encounter for general adult medical examination without abnormal findings: Secondary | ICD-10-CM

## 2023-10-14 DIAGNOSIS — Z13220 Encounter for screening for lipoid disorders: Secondary | ICD-10-CM
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
## 2023-10-15 LAB — LIPID PANEL
Cholesterol: 161 mg/dL (ref <200)
HDL: 48 mg/dL (ref >=40)
LDL Cholesterol (Calc): 102 mg/dL — ABNORMAL HIGH
Non-HDL Cholesterol (Calc): 113 mg/dL (ref <130)
Total CHOL/HDL Ratio: 3.4 (calc) (ref <5.0)
Triglycerides: 39 mg/dL (ref <150)

## 2023-10-15 LAB — CBC WITH DIFFERENTIAL/PLATELET
Absolute Lymphocytes: 2279 {cells}/uL (ref 850–3900)
Absolute Monocytes: 412 {cells}/uL (ref 200–950)
Basophils Absolute: 52 {cells}/uL (ref 0–200)
Basophils Relative: 0.9 %
Eosinophils Absolute: 122 {cells}/uL (ref 15–500)
Eosinophils Relative: 2.1 %
HCT: 44.3 % (ref 38.5–50.0)
Hemoglobin: 15.1 g/dL (ref 13.2–17.1)
MCH: 31 pg (ref 27.0–33.0)
MCHC: 34.1 g/dL (ref 32.0–36.0)
MCV: 91 fL (ref 80.0–100.0)
MPV: 10.2 fL (ref 7.5–12.5)
Monocytes Relative: 7.1 %
Neutro Abs: 2935 {cells}/uL (ref 1500–7800)
Neutrophils Relative %: 50.6 %
Platelets: 267 10*3/uL (ref 140–400)
RBC: 4.87 10*6/uL (ref 4.20–5.80)
RDW: 11.7 % (ref 11.0–15.0)
Total Lymphocyte: 39.3 %
WBC: 5.8 10*3/uL (ref 3.8–10.8)

## 2023-10-15 LAB — COMPLETE METABOLIC PANEL WITH GFR
AG Ratio: 2.3 (calc) (ref 1.0–2.5)
ALT: 14 U/L (ref 9–46)
AST: 16 U/L (ref 10–40)
Albumin: 4.8 g/dL (ref 3.6–5.1)
Alkaline phosphatase (APISO): 49 U/L (ref 36–130)
BUN: 23 mg/dL (ref 7–25)
CO2: 31 mmol/L (ref 20–32)
Calcium: 10 mg/dL (ref 8.6–10.3)
Chloride: 102 mmol/L (ref 98–110)
Creat: 1.1 mg/dL (ref 0.60–1.24)
Globulin: 2.1 g/dL (ref 1.9–3.7)
Glucose, Bld: 90 mg/dL (ref 65–99)
Potassium: 4.2 mmol/L (ref 3.5–5.3)
Sodium: 142 mmol/L (ref 135–146)
Total Bilirubin: 0.5 mg/dL (ref 0.2–1.2)
Total Protein: 6.9 g/dL (ref 6.1–8.1)
eGFR: 94 mL/min/{1.73_m2} (ref >=60)

## 2024-06-01 ENCOUNTER — Encounter: Payer: Self-pay | Admitting: Family Medicine

## 2024-06-02 ENCOUNTER — Other Ambulatory Visit: Payer: Self-pay | Admitting: Family Medicine

## 2024-06-02 ENCOUNTER — Other Ambulatory Visit: Payer: Self-pay

## 2024-06-02 MED ORDER — PREDNISONE 20 MG PO TABS: ORAL_TABLET | ORAL | 0 refills | Status: AC

## 2024-10-19 ENCOUNTER — Ambulatory Visit (INDEPENDENT_AMBULATORY_CARE_PROVIDER_SITE_OTHER): Admitting: Family Medicine

## 2024-10-19 ENCOUNTER — Encounter: Payer: Self-pay | Admitting: Family Medicine

## 2024-10-19 VITALS — BP 120/76 | HR 65 | Temp 98.1°F | Ht 70.0 in | Wt 173.2 lb

## 2024-10-19 DIAGNOSIS — Z0000 Encounter for general adult medical examination without abnormal findings: Secondary | ICD-10-CM | POA: Diagnosis not present
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
## 2024-10-20 ENCOUNTER — Ambulatory Visit: Payer: Self-pay | Admitting: Family Medicine

## 2024-10-20 LAB — CBC WITH DIFFERENTIAL/PLATELET
Absolute Lymphocytes: 2393 {cells}/uL (ref 850–3900)
Absolute Monocytes: 563 {cells}/uL (ref 200–950)
Basophils Absolute: 53 {cells}/uL (ref 0–200)
Basophils Relative: 0.7 %
Eosinophils Absolute: 90 {cells}/uL (ref 15–500)
Eosinophils Relative: 1.2 %
HCT: 48.6 % (ref 38.5–50.0)
Hemoglobin: 16.3 g/dL (ref 13.2–17.1)
MCH: 30.9 pg (ref 27.0–33.0)
MCHC: 33.5 g/dL (ref 32.0–36.0)
MCV: 92 fL (ref 80.0–100.0)
MPV: 10.3 fL (ref 7.5–12.5)
Monocytes Relative: 7.5 %
Neutro Abs: 4403 {cells}/uL (ref 1500–7800)
Neutrophils Relative %: 58.7 %
Platelets: 295 Thousand/uL (ref 140–400)
RBC: 5.28 Million/uL (ref 4.20–5.80)
RDW: 12 % (ref 11.0–15.0)
Total Lymphocyte: 31.9 %
WBC: 7.5 Thousand/uL (ref 3.8–10.8)

## 2024-10-20 LAB — COMPREHENSIVE METABOLIC PANEL WITH GFR
AG Ratio: 2.2 (calc) (ref 1.0–2.5)
ALT: 12 U/L (ref 9–46)
AST: 16 U/L (ref 10–40)
Albumin: 5.1 g/dL (ref 3.6–5.1)
Alkaline phosphatase (APISO): 47 U/L (ref 36–130)
BUN: 16 mg/dL (ref 7–25)
CO2: 31 mmol/L (ref 20–32)
Calcium: 10.4 mg/dL — ABNORMAL HIGH (ref 8.6–10.3)
Chloride: 99 mmol/L (ref 98–110)
Creat: 1.09 mg/dL (ref 0.60–1.24)
Globulin: 2.3 g/dL (ref 1.9–3.7)
Glucose, Bld: 86 mg/dL (ref 65–99)
Potassium: 4 mmol/L (ref 3.5–5.3)
Sodium: 139 mmol/L (ref 135–146)
Total Bilirubin: 1 mg/dL (ref 0.2–1.2)
Total Protein: 7.4 g/dL (ref 6.1–8.1)
eGFR: 94 mL/min/1.73m2 (ref >=60)

## 2024-10-20 LAB — LIPID PANEL
Cholesterol: 189 mg/dL (ref <200)
HDL: 54 mg/dL (ref >=40)
LDL Cholesterol (Calc): 119 mg/dL — ABNORMAL HIGH
Non-HDL Cholesterol (Calc): 135 mg/dL — ABNORMAL HIGH (ref <130)
Total CHOL/HDL Ratio: 3.5 (calc) (ref <5.0)
Triglycerides: 68 mg/dL (ref <150)
